# Patient Record
Sex: Female | Born: 1990 | Race: White | Hispanic: No | Marital: Single | Smoking: Never smoker
Health system: Southern US, Community
[De-identification: ages and names within clinical notes are randomized; demographics above are authoritative.]

## PROBLEM LIST (undated history)

## (undated) DIAGNOSIS — J302 Other seasonal allergic rhinitis: Secondary | ICD-10-CM

## (undated) DIAGNOSIS — G43009 Migraine without aura, not intractable, without status migrainosus: Secondary | ICD-10-CM

## (undated) DIAGNOSIS — E785 Hyperlipidemia, unspecified: Secondary | ICD-10-CM

## (undated) DIAGNOSIS — Z973 Presence of spectacles and contact lenses: Secondary | ICD-10-CM

## (undated) DIAGNOSIS — N939 Abnormal uterine and vaginal bleeding, unspecified: Secondary | ICD-10-CM

## (undated) DIAGNOSIS — E282 Polycystic ovarian syndrome: Secondary | ICD-10-CM

## (undated) HISTORY — DX: Migraine without aura, not intractable, without status migrainosus: G43.009

## (undated) HISTORY — DX: Polycystic ovarian syndrome: E28.2

## (undated) HISTORY — PX: WISDOM TOOTH EXTRACTION: SHX21

## (undated) HISTORY — DX: Morbid (severe) obesity due to excess calories: E66.01

---
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
## 2013-10-08 DIAGNOSIS — G43909 Migraine, unspecified, not intractable, without status migrainosus: Secondary | ICD-10-CM | POA: Insufficient documentation

## 2013-10-08 DIAGNOSIS — K9089 Other intestinal malabsorption: Secondary | ICD-10-CM | POA: Insufficient documentation

## 2017-01-24 ENCOUNTER — Other Ambulatory Visit (HOSPITAL_COMMUNITY)
Admission: RE | Admit: 2017-01-24 | Discharge: 2017-01-24 | Disposition: A | Discharge status: Home / Self Care | Payer: BLUE CROSS/BLUE SHIELD | Source: Ambulatory Visit | Attending: Obstetrics and Gynecology | Admitting: Obstetrics and Gynecology

## 2017-01-24 ENCOUNTER — Other Ambulatory Visit: Payer: Self-pay | Admitting: Nurse Practitioner

## 2017-01-24 DIAGNOSIS — Z01419 Encounter for gynecological examination (general) (routine) without abnormal findings: Secondary | ICD-10-CM | POA: Insufficient documentation

## 2017-01-25 ENCOUNTER — Other Ambulatory Visit: Payer: Self-pay | Admitting: Nurse Practitioner

## 2017-01-25 DIAGNOSIS — N6310 Unspecified lump in the right breast, unspecified quadrant: Secondary | ICD-10-CM

## 2017-01-30 ENCOUNTER — Other Ambulatory Visit: Payer: Self-pay

## 2017-01-30 LAB — CYTOLOGY - PAP: Diagnosis: NEGATIVE

## 2017-01-31 ENCOUNTER — Ambulatory Visit
Admission: RE | Admit: 2017-01-31 | Discharge: 2017-01-31 | Disposition: A | Discharge status: Home / Self Care | Payer: BLUE CROSS/BLUE SHIELD | Source: Ambulatory Visit | Attending: Nurse Practitioner | Admitting: Nurse Practitioner

## 2017-01-31 DIAGNOSIS — N6310 Unspecified lump in the right breast, unspecified quadrant: Secondary | ICD-10-CM

## 2017-08-19 DIAGNOSIS — Z9109 Other allergy status, other than to drugs and biological substances: Secondary | ICD-10-CM | POA: Insufficient documentation

## 2020-04-13 ENCOUNTER — Ambulatory Visit: Payer: BLUE CROSS/BLUE SHIELD | Admitting: Internal Medicine

## 2020-04-28 ENCOUNTER — Encounter: Payer: Self-pay | Admitting: Internal Medicine

## 2020-04-28 ENCOUNTER — Ambulatory Visit (INDEPENDENT_AMBULATORY_CARE_PROVIDER_SITE_OTHER): Payer: 59 | Admitting: Internal Medicine

## 2020-04-28 ENCOUNTER — Other Ambulatory Visit: Payer: Self-pay

## 2020-04-28 VITALS — BP 140/90 | HR 101 | Temp 98.2°F | Ht 62.25 in | Wt 225.9 lb

## 2020-04-28 DIAGNOSIS — R030 Elevated blood-pressure reading, without diagnosis of hypertension: Secondary | ICD-10-CM

## 2020-04-28 DIAGNOSIS — E282 Polycystic ovarian syndrome: Secondary | ICD-10-CM

## 2020-04-28 DIAGNOSIS — R221 Localized swelling, mass and lump, neck: Secondary | ICD-10-CM

## 2020-04-28 LAB — TSH: TSH: 1.98 u[IU]/mL (ref 0.35–4.50)

## 2020-04-28 LAB — T4, FREE: Free T4: 0.77 ng/dL (ref 0.60–1.60)

## 2020-04-28 LAB — T3, FREE: T3, Free: 3.7 pg/mL (ref 2.3–4.2)

## 2020-04-28 NOTE — Progress Notes (Signed)
New Patient Office Visit     This visit occurred during the SARS-CoV-2 public health emergency.  Safety protocols were in place, including screening questions prior to the visit, additional usage of staff PPE, and extensive cleaning of exam room while observing appropriate contact time as indicated for disinfecting solutions.    CC/Reason for Visit: Establish care, discuss acute and chronic concerns Previous PCP: None Last Visit: Unknown  HPI: Krystal Leonard is a 30 y.o. female who is coming in today for the above mentioned reasons.  She is here today to establish care.  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  Her family history is significant for ovarian cancer in her paternal grandmother.  She does not smoke, she drinks alcohol only occasionally.  She has no known drug allergies.  Her past surgical history is significant for wisdom teeth extraction and a breast reduction in 2007.  Since last May she has been dealing with bouts of occasional neck pain with difficulty swallowing.  She brings in some thyroid function tests from that time that were within normal range.  She has a history of polycystic ovarian syndrome, has been dealing with hirsutism and obesity as well as irregular periods.  She is not currently trying to conceive.  She does not have a local gynecologist.   Past Medical/Surgical History: Past Medical History:  Diagnosis Date  . Morbid obesity (HCC)   . PCOS (polycystic ovarian syndrome)     Past Surgical History:  Procedure Laterality Date  . REDUCTION MAMMAPLASTY Bilateral 2007   bilateral reduction 2007    Social History:  reports that she has never smoked. She has never used smokeless tobacco. She reports current alcohol use. She reports that she does not use drugs.  Allergies: Allergies  Allergen Reactions  . Latex Itching    Family History:  Family History  Problem Relation Age of Onset  . Ovarian cancer Paternal Grandmother       Current Outpatient Medications:  .  cetirizine (ZYRTEC) 10 MG tablet, Zyrtec 10 mg tablet  Take 1 tablet every day by oral route as needed., Disp: , Rfl:  .  Multiple Vitamins-Minerals (CENTRUM WOMEN PO), Take by mouth., Disp: , Rfl:  .  vitamin B-12 (CYANOCOBALAMIN) 1000 MCG tablet, Take 1,000 mcg by mouth daily., Disp: , Rfl:   Review of Systems:  Constitutional: Denies fever, chills, diaphoresis, appetite change and fatigue.  HEENT: Denies photophobia, eye pain, redness, hearing loss, ear pain, congestion, sore throat, rhinorrhea, sneezing, mouth sores,  neck stiffness and tinnitus.   Respiratory: Denies SOB, DOE, cough, chest tightness,  and wheezing.   Cardiovascular: Denies chest pain, palpitations and leg swelling.  Gastrointestinal: Denies nausea, vomiting, abdominal pain, diarrhea, constipation, blood in stool and abdominal distention.  Genitourinary: Denies dysuria, urgency, frequency, hematuria, flank pain and difficulty urinating.  Endocrine: Denies: hot or cold intolerance, sweats, changes in hair or nails, polyuria, polydipsia. Musculoskeletal: Denies myalgias, back pain, joint swelling, arthralgias and gait problem.  Skin: Denies pallor, rash and wound.  Neurological: Denies dizziness, seizures, syncope, weakness, light-headedness, numbness and headaches.  Hematological: Denies adenopathy. Easy bruising, personal or family bleeding history  Psychiatric/Behavioral: Denies suicidal ideation, mood changes, confusion, nervousness, sleep disturbance and agitation    Physical Exam: Vitals:   04/28/20 1343  BP: 140/90  Pulse: (!) 101  Temp: 98.2 F (36.8 C)  TempSrc: Oral  SpO2: 98%  Weight: 225 lb 14.4 oz (102.5 kg)  Height: 5' 2.25" (1.581 m)  Body mass index is 40.99 kg/m.  Constitutional: NAD, calm, comfortable, obese Eyes: PERRL, lids and conjunctivae normal ENMT: Mucous membranes are moist. Neck: normal, supple, significant neck fullness, no pain to  palpation Respiratory: clear to auscultation bilaterally, no wheezing, no crackles. Normal respiratory effort. No accessory muscle use.  Cardiovascular: Regular rate and rhythm, no murmurs / rubs / gallops. No extremity edema.  Neurologic: Grossly intact and nonfocal Psychiatric: Normal judgment and insight. Alert and oriented x 3. Normal mood.    Impression and Plan:  Neck fullness  -With neck fullness and occasional pain I wonder about thyroiditis. -I will order thyroid function tests today and we will send her for thyroid ultrasound.  Pending results may benefit from endocrinology referral.  PCOS (polycystic ovarian syndrome) -I will initiate referral to GYN, suspect hirsutism is related to this, might benefit from metformin or spironolactone therapy.  Morbid obesity (HCC) -Discussed healthy lifestyle, including increased physical activity and better food choices to promote weight loss.  Elevated BP without diagnosis of hypertension -She will do ambulatory blood pressure monitoring and return in 8 to 12 weeks for follow-up.    Patient Instructions  -Nice seeing you today!!  -Lab work today; will notify you once results are available.  -See you back in 3 months for your physical. Please come in fasting that day.  -Make sure you check your blood pressure 2-3 times a week until I see you next.       [name], MD [facility]

## 2020-04-28 NOTE — Patient Instructions (Signed)
-  Nice seeing you today!!  -Lab work today; will notify you once results are available.  -See you back in 3 months for your physical. Please come in fasting that day.  -Make sure you check your blood pressure 2-3 times a week until I see you next.

## 2020-04-29 ENCOUNTER — Other Ambulatory Visit: Payer: Self-pay | Admitting: Internal Medicine

## 2020-04-29 DIAGNOSIS — Z124 Encounter for screening for malignant neoplasm of cervix: Secondary | ICD-10-CM

## 2020-05-27 ENCOUNTER — Encounter: Payer: Self-pay | Admitting: Obstetrics and Gynecology

## 2020-05-27 ENCOUNTER — Ambulatory Visit (INDEPENDENT_AMBULATORY_CARE_PROVIDER_SITE_OTHER): Payer: 59 | Admitting: Obstetrics and Gynecology

## 2020-05-27 ENCOUNTER — Other Ambulatory Visit: Payer: Self-pay

## 2020-05-27 VITALS — BP 110/72 | HR 107 | Ht 62.0 in | Wt 225.0 lb

## 2020-05-27 DIAGNOSIS — N939 Abnormal uterine and vaginal bleeding, unspecified: Secondary | ICD-10-CM

## 2020-05-27 DIAGNOSIS — Z01419 Encounter for gynecological examination (general) (routine) without abnormal findings: Secondary | ICD-10-CM | POA: Diagnosis not present

## 2020-05-27 DIAGNOSIS — Z6841 Body Mass Index (BMI) 40.0 and over, adult: Secondary | ICD-10-CM | POA: Diagnosis not present

## 2020-05-27 DIAGNOSIS — E282 Polycystic ovarian syndrome: Secondary | ICD-10-CM | POA: Diagnosis not present

## 2020-05-27 DIAGNOSIS — Z0000 Encounter for general adult medical examination without abnormal findings: Secondary | ICD-10-CM

## 2020-05-27 DIAGNOSIS — L680 Hirsutism: Secondary | ICD-10-CM

## 2020-05-27 NOTE — H&P (View-Only) (Signed)
30 y.o. No obstetric history on file. Single White or Caucasian Not Hispanic or Latino female here for annual exam. She has had a traumatic experience at her last GYN visit in 2019. She has recently had some thyroid issues. She states that she went to urgent care Aug 31, 2019 feeling very lethargic. She had thyroid labs done there and has the results with her (normal).  She has also been on her period since December of 2021. She states that yesterday she had heavy bleeding She states that she filled a pad in the 5 minute drive. She filled a pad again trying to walk to the next bathroom. She then filled another pad in about 30 min. She states that after she filled a pad about every 30 min.    She also has facial hair and nipple hair.     Per review of prior notes she didn't tolerate OCP's or metformin in the past. She had breast enlargement with OCP's, had a reduction. She doesn't want to try OCP's, had severe suicidal thoughts previously on OCP's.  She bleed almost daily from 2016-2019. She had a traumatic experience with her prior gyn. Very uncomfortable with pelvic exam. She was traumatized by the experience.  She has never had regular cycles. In the last year she has had ~ 4 cycles. She typically bleeds for 5-7 days, can go weeks at a time.  This cycle started on 03/22/20 and she is still bleeding. Mostly light, some days are heavy. Yesterday was very heavy, saturated a pad in 5 minutes 2 x. Then saturated a pad every 1-1.5 hours. Today flow has been okay. Not light headed or dizzy.   She has some hair growth on chin, lower abdomen, a few around her nipples. No galactorrhea.  Never sexually active.   Patient's last menstrual period was 03/22/2020.          Sexually active: No.  The current method of family planning is none.    Exercising: No.  The patient does not participate in regular exercise at present. Smoker:  no  Health Maintenance: Pap:  01/24/2017 Normal  History of abnormal Pap:   no MMG:  None 2018 ultrasound of right breast  BMD:   None  Colonoscopy: none  TDaP:  2018 Gardasil: complete    reports that she has never smoked. She has never used smokeless tobacco. She reports current alcohol use. She reports that she does not use drugs. Social ETOH, rare. 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   Past Medical History:  Diagnosis Date  . Morbid obesity (HCC)   . PCOS (polycystic ovarian syndrome)     Past Surgical History:  Procedure Laterality Date  . REDUCTION MAMMAPLASTY Bilateral 2007   bilateral reduction 2007    Current Outpatient Medications  Medication Sig Dispense Refill  . cetirizine (ZYRTEC) 10 MG tablet Zyrtec 10 mg tablet  Take 1 tablet every day by oral route as needed.    . Multiple Vitamins-Minerals (CENTRUM WOMEN PO) Take by mouth.    . vitamin B-12 (CYANOCOBALAMIN) 1000 MCG tablet Take 1,000 mcg by mouth daily.     No current facility-administered medications for this visit.    Family History  Problem Relation Age of Onset  . Ovarian cancer Paternal Grandmother     Review of Systems  Constitutional: Positive for fatigue.  Genitourinary: Positive for vaginal bleeding.    Exam:   Pulse (!) 107   Ht 5\' 2"  (1.575 m)   Wt  225 lb (102.1 kg)   LMP 03/22/2020   SpO2 99%   BMI 41.15 kg/m   Weight change: @WEIGHTCHANGE @ Height:   Height: 5\' 2"  (157.5 cm)  Ht Readings from Last 3 Encounters:  05/27/20 5\' 2"  (1.575 m)  04/28/20 5' 2.25" (1.581 m)    General appearance: alert, cooperative and appears stated age Head: Normocephalic, without obvious abnormality, atraumatic Neck: no adenopathy, supple, symmetrical, trachea midline and thyroid normal to inspection and palpation Lungs: clear to auscultation bilaterally Cardiovascular: regular rate and rhythm Breasts: normal appearance, no masses or tenderness Abdomen: soft, non-tender; non distended,  no masses,  no organomegaly Extremities: extremities normal,  atraumatic, no cyanosis or edema Skin: Skin color, texture, turgor normal. No rashes or lesions Lymph nodes: Cervical, supraclavicular, and axillary nodes normal. No abnormal inguinal nodes palpated Neurologic: Grossly normal Pelvic: deferred.    1. Well woman exam Discussed breast self exam Discussed calcium and vit D intake  2. BMI 40.0-44.9, adult (HCC)   3. PCOS (polycystic ovarian syndrome) - Hemoglobin A1c - Lipid panel  4. Abnormal uterine bleeding (AUB) Oligomenorrhea followed by prolonged, heavy vaginal bleeding. Needs a biopsy, hasn't tolerated a pelvic exam in the office. I don't feel she will tolerate an office biopsy - CBC - Ferritin - Prolactin -Plan: EUA, hysteroscopy, dilation and curettage, pap. Reviewed risks, including: bleeding, infection, uterine perforation, fluid overload, need for further sugery   5. Hirsutism  - 17-Hydroxyprogesterone - Testosterone, Total, LC/MS/MS  6. Laboratory exam ordered as part of routine general medical examination  - CBC - Comprehensive metabolic panel - Lipid panel

## 2020-05-27 NOTE — Patient Instructions (Signed)

## 2020-05-27 NOTE — Progress Notes (Addendum)
30 y.o. No obstetric history on file. Single White or Caucasian Not Hispanic or Latino female here for annual exam. She has had a traumatic experience at her last GYN visit in 2019. She has recently had some thyroid issues. She states that she went to urgent care Aug 31, 2019 feeling very lethargic. She had thyroid labs done there and has the results with her (normal).  She has also been on her period since December of 2021. She states that yesterday she had heavy bleeding She states that she filled a pad in the 5 minute drive. She filled a pad again trying to walk to the next bathroom. She then filled another pad in about 30 min. She states that after she filled a pad about every 30 min.    She also has facial hair and nipple hair.     Per review of prior notes she didn't tolerate OCP's or metformin in the past. She had breast enlargement with OCP's, had a reduction. She doesn't want to try OCP's, had severe suicidal thoughts previously on OCP's.  She bleed almost daily from 2016-2019. She had a traumatic experience with her prior gyn. Very uncomfortable with pelvic exam. She was traumatized by the experience.  She has never had regular cycles. In the last year she has had ~ 4 cycles. She typically bleeds for 5-7 days, can go weeks at a time.  This cycle started on 03/22/20 and she is still bleeding. Mostly light, some days are heavy. Yesterday was very heavy, saturated a pad in 5 minutes 2 x. Then saturated a pad every 1-1.5 hours. Today flow has been okay. Not light headed or dizzy.   She has some hair growth on chin, lower abdomen, a few around her nipples. No galactorrhea.  Never sexually active.   Patient's last menstrual period was 03/22/2020.          Sexually active: No.  The current method of family planning is none.    Exercising: No.  The patient does not participate in regular exercise at present. Smoker:  no  Health Maintenance: Pap:  01/24/2017 Normal  History of abnormal Pap:   no MMG:  None 2018 ultrasound of right breast  BMD:   None  Colonoscopy: none  TDaP:  2018 Gardasil: complete    reports that she has never smoked. She has never used smokeless tobacco. She reports current alcohol use. She reports that she does not use drugs. Social ETOH, rare. 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   Past Medical History:  Diagnosis Date  . Morbid obesity (HCC)   . PCOS (polycystic ovarian syndrome)     Past Surgical History:  Procedure Laterality Date  . REDUCTION MAMMAPLASTY Bilateral 2007   bilateral reduction 2007    Current Outpatient Medications  Medication Sig Dispense Refill  . cetirizine (ZYRTEC) 10 MG tablet Zyrtec 10 mg tablet  Take 1 tablet every day by oral route as needed.    . Multiple Vitamins-Minerals (CENTRUM WOMEN PO) Take by mouth.    . vitamin B-12 (CYANOCOBALAMIN) 1000 MCG tablet Take 1,000 mcg by mouth daily.     No current facility-administered medications for this visit.    Family History  Problem Relation Age of Onset  . Ovarian cancer Paternal Grandmother     Review of Systems  Constitutional: Positive for fatigue.  Genitourinary: Positive for vaginal bleeding.    Exam:   Pulse (!) 107   Ht 5\' 2"  (1.575 m)   Wt  225 lb (102.1 kg)   LMP 03/22/2020   SpO2 99%   BMI 41.15 kg/m   Weight change: @WEIGHTCHANGE @ Height:   Height: 5\' 2"  (157.5 cm)  Ht Readings from Last 3 Encounters:  05/27/20 5\' 2"  (1.575 m)  04/28/20 5' 2.25" (1.581 m)    General appearance: alert, cooperative and appears stated age Head: Normocephalic, without obvious abnormality, atraumatic Neck: no adenopathy, supple, symmetrical, trachea midline and thyroid normal to inspection and palpation Lungs: clear to auscultation bilaterally Cardiovascular: regular rate and rhythm Breasts: normal appearance, no masses or tenderness Abdomen: soft, non-tender; non distended,  no masses,  no organomegaly Extremities: extremities normal,  atraumatic, no cyanosis or edema Skin: Skin color, texture, turgor normal. No rashes or lesions Lymph nodes: Cervical, supraclavicular, and axillary nodes normal. No abnormal inguinal nodes palpated Neurologic: Grossly normal Pelvic: deferred.    1. Well woman exam Discussed breast self exam Discussed calcium and vit D intake  2. BMI 40.0-44.9, adult (HCC)   3. PCOS (polycystic ovarian syndrome) - Hemoglobin A1c - Lipid panel  4. Abnormal uterine bleeding (AUB) Oligomenorrhea followed by prolonged, heavy vaginal bleeding. Needs a biopsy, hasn't tolerated a pelvic exam in the office. I don't feel she will tolerate an office biopsy - CBC - Ferritin - Prolactin -Plan: EUA, hysteroscopy, dilation and curettage, pap. Reviewed risks, including: bleeding, infection, uterine perforation, fluid overload, need for further sugery   5. Hirsutism  - 17-Hydroxyprogesterone - Testosterone, Total, LC/MS/MS  6. Laboratory exam ordered as part of routine general medical examination  - CBC - Comprehensive metabolic panel - Lipid panel    Addendum: Old records reviewed. 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 No other significant findings. Only transabdominal imaging  No renal imaging reported.

## 2020-06-01 ENCOUNTER — Telehealth: Payer: Self-pay | Admitting: *Deleted

## 2020-06-01 NOTE — Telephone Encounter (Signed)
Call placed to patient to review surgery dates and Covid quarantine requirements. Patient declines 06/13/20. Patient states she will need to review her schedule and return call.

## 2020-06-01 NOTE — Telephone Encounter (Signed)
-----   Message from [name], MD sent at 05/27/2020 12:49 PM EST ----- Krystal Leonard, Please schedule this patient for a exam under anesthesia, pap, hysteroscopy, D&C sometime soon if possible. She has been having heavy anovulatory bleeding.  H/O AUB, unable to tolerate office exam definitely won't tolerate a biopsy. No risk of pregnancy. Thanks,  [name]  CCMayme Genta

## 2020-06-02 LAB — COMPREHENSIVE METABOLIC PANEL
AG Ratio: 1.9 (calc) (ref 1.0–2.5)
ALT: 15 U/L (ref 6–29)
AST: 19 U/L (ref 10–30)
Albumin: 4.3 g/dL (ref 3.6–5.1)
Alkaline phosphatase (APISO): 92 U/L (ref 31–125)
BUN: 9 mg/dL (ref 7–25)
CO2: 24 mmol/L (ref 20–32)
Calcium: 9.2 mg/dL (ref 8.6–10.2)
Chloride: 105 mmol/L (ref 98–110)
Creat: 0.63 mg/dL (ref 0.50–1.10)
Globulin: 2.3 g/dL (calc) (ref 1.9–3.7)
Glucose, Bld: 82 mg/dL (ref 65–99)
Potassium: 4.2 mmol/L (ref 3.5–5.3)
Sodium: 139 mmol/L (ref 135–146)
Total Bilirubin: 0.5 mg/dL (ref 0.2–1.2)
Total Protein: 6.6 g/dL (ref 6.1–8.1)

## 2020-06-02 LAB — CBC
HCT: 44.8 % (ref 35.0–45.0)
Hemoglobin: 15.3 g/dL (ref 11.7–15.5)
MCH: 32.9 pg (ref 27.0–33.0)
MCHC: 34.2 g/dL (ref 32.0–36.0)
MCV: 96.3 fL (ref 80.0–100.0)
MPV: 10.1 fL (ref 7.5–12.5)
Platelets: 278 10*3/uL (ref 140–400)
RBC: 4.65 10*6/uL (ref 3.80–5.10)
RDW: 12.6 % (ref 11.0–15.0)
WBC: 7.8 10*3/uL (ref 3.8–10.8)

## 2020-06-02 LAB — PROLACTIN: Prolactin: 6.6 ng/mL

## 2020-06-02 LAB — LIPID PANEL
Cholesterol: 197 mg/dL (ref <200)
HDL: 36 mg/dL — ABNORMAL LOW (ref >=50)
LDL Cholesterol (Calc): 115 mg/dL (calc) — ABNORMAL HIGH
Non-HDL Cholesterol (Calc): 161 mg/dL (calc) — ABNORMAL HIGH (ref <130)
Total CHOL/HDL Ratio: 5.5 (calc) — ABNORMAL HIGH (ref <5.0)
Triglycerides: 319 mg/dL — ABNORMAL HIGH (ref <150)

## 2020-06-02 LAB — HEMOGLOBIN A1C
Hgb A1c MFr Bld: 4.8 % of total Hgb (ref <5.7)
Mean Plasma Glucose: 91 mg/dL
eAG (mmol/L): 5 mmol/L

## 2020-06-02 LAB — FERRITIN: Ferritin: 41 ng/mL (ref 16–154)

## 2020-06-02 LAB — TESTOSTERONE, TOTAL, LC/MS/MS: Testosterone, Total, LC-MS-MS: 63 ng/dL — ABNORMAL HIGH (ref 2–45)

## 2020-06-02 LAB — 17-HYDROXYPROGESTERONE: 17-OH-Progesterone, LC/MS/MS: 83 ng/dL

## 2020-06-07 NOTE — Telephone Encounter (Signed)
Spoke with patient, reviewed available surgery dates, patient request to proceed on 06/21/20.  Advised will return call to review pre-op once surgery confirmed. Reviewed Covid requirements.  Patient verbalizes understanding and is agreeable.   Patient states she had a PUS in 2018, has requested report to be sent to Dr. Oscar La.  Advised I will update Dr. Oscar La and return call if any additional recommendations.   Dr. Oscar La -have you received [place] report/records?   Cc: Hayley Carder

## 2020-06-07 NOTE — Telephone Encounter (Signed)
Call returned to patient.  Left detailed message, ok per dpr.  Advised 06/20/20 not available, offered 06/21/20. Return call to office to confirm.

## 2020-06-07 NOTE — Telephone Encounter (Signed)
Spoke with patient regarding surgery benefits. Patient acknowledges understanding of information presented. Patient is aware that benefits presented are professional benefits only. Patient is aware that once surgery is scheduled, the hospital will call with separate benefits. See account note.  Routing to Jill Hamm, RN, for surgery scheduling. 

## 2020-06-08 NOTE — Telephone Encounter (Signed)
Left message to call Ronnica Dreese, RN at [facility], [phone].  

## 2020-06-09 NOTE — Telephone Encounter (Signed)
Call to patient to review pre-op instructions. Patient is traveling, will return call on 2/28 to review.

## 2020-06-13 NOTE — Telephone Encounter (Signed)
Spoke with patient. Surgery date request confirmed.  Advised surgery is scheduled for 06/21/20, [facility] at 1100. Surgery instruction sheet and hospital brochure reviewed, printed copy will be mailed to address.  Patient advised if Covid screening and quarantine requirements and agreeable.   Routing to provider. Encounter closed.   Cc: Hayley Carder

## 2020-06-15 ENCOUNTER — Encounter (HOSPITAL_BASED_OUTPATIENT_CLINIC_OR_DEPARTMENT_OTHER): Payer: Self-pay | Admitting: Obstetrics and Gynecology

## 2020-06-15 ENCOUNTER — Other Ambulatory Visit: Payer: Self-pay

## 2020-06-15 NOTE — Progress Notes (Signed)
Spoke w/ via phone for pre-op interview--- PT Lab needs dos----  Urine preg (per anes)/  Pre-op orders pending             Lab results------ no COVID test ------ 06-17-2020 @ 0820 Arrive at ------- 0900 on 06-21-2020 NPO after MN NO Solid Food.  Clear liquids from MN until--- 0800 Medications to take morning of surgery ----- NONE Diabetic medication ----- n/a Patient Special Instructions ----- n/a Pre-Op special Istructions ----- sent inbox message in epic to dr Oscar La for orders Patient verbalized understanding of instructions that were given at this phone interview. Patient denies shortness of breath, chest pain, fever, cough at this phone interview.

## 2020-06-17 ENCOUNTER — Other Ambulatory Visit (HOSPITAL_COMMUNITY)
Admission: RE | Admit: 2020-06-17 | Discharge: 2020-06-17 | Disposition: A | Discharge status: Home / Self Care | Payer: 59 | Source: Ambulatory Visit | Attending: Obstetrics and Gynecology | Admitting: Obstetrics and Gynecology

## 2020-06-17 DIAGNOSIS — Z01812 Encounter for preprocedural laboratory examination: Secondary | ICD-10-CM | POA: Diagnosis present

## 2020-06-17 DIAGNOSIS — Z20822 Contact with and (suspected) exposure to covid-19: Secondary | ICD-10-CM | POA: Diagnosis not present

## 2020-06-17 LAB — SARS CORONAVIRUS 2 (TAT 6-24 HRS): SARS Coronavirus 2: NEGATIVE

## 2020-06-21 ENCOUNTER — Other Ambulatory Visit (HOSPITAL_COMMUNITY)
Admission: RE | Admit: 2020-06-21 | Discharge: 2020-06-21 | Disposition: A | Discharge status: Home / Self Care | Payer: 59 | Source: Ambulatory Visit | Attending: Obstetrics and Gynecology | Admitting: Obstetrics and Gynecology

## 2020-06-21 ENCOUNTER — Ambulatory Visit (HOSPITAL_BASED_OUTPATIENT_CLINIC_OR_DEPARTMENT_OTHER): Payer: 59 | Admitting: Registered Nurse

## 2020-06-21 ENCOUNTER — Encounter (HOSPITAL_BASED_OUTPATIENT_CLINIC_OR_DEPARTMENT_OTHER): Payer: Self-pay | Admitting: Obstetrics and Gynecology

## 2020-06-21 ENCOUNTER — Other Ambulatory Visit: Payer: Self-pay

## 2020-06-21 ENCOUNTER — Encounter (HOSPITAL_BASED_OUTPATIENT_CLINIC_OR_DEPARTMENT_OTHER)
Admission: RE | Disposition: A | Discharge status: Home / Self Care | Payer: Self-pay | Source: Home / Self Care | Attending: Obstetrics and Gynecology

## 2020-06-21 ENCOUNTER — Ambulatory Visit (HOSPITAL_BASED_OUTPATIENT_CLINIC_OR_DEPARTMENT_OTHER)
Admission: RE | Admit: 2020-06-21 | Discharge: 2020-06-21 | Disposition: A | Discharge status: Home / Self Care | Payer: 59 | Attending: Obstetrics and Gynecology | Admitting: Obstetrics and Gynecology

## 2020-06-21 DIAGNOSIS — N840 Polyp of corpus uteri: Secondary | ICD-10-CM | POA: Insufficient documentation

## 2020-06-21 DIAGNOSIS — Z8041 Family history of malignant neoplasm of ovary: Secondary | ICD-10-CM | POA: Insufficient documentation

## 2020-06-21 DIAGNOSIS — E282 Polycystic ovarian syndrome: Secondary | ICD-10-CM | POA: Insufficient documentation

## 2020-06-21 DIAGNOSIS — Z6841 Body Mass Index (BMI) 40.0 and over, adult: Secondary | ICD-10-CM | POA: Insufficient documentation

## 2020-06-21 DIAGNOSIS — N970 Female infertility associated with anovulation: Secondary | ICD-10-CM | POA: Diagnosis not present

## 2020-06-21 DIAGNOSIS — Z124 Encounter for screening for malignant neoplasm of cervix: Secondary | ICD-10-CM | POA: Insufficient documentation

## 2020-06-21 DIAGNOSIS — L680 Hirsutism: Secondary | ICD-10-CM | POA: Insufficient documentation

## 2020-06-21 DIAGNOSIS — N939 Abnormal uterine and vaginal bleeding, unspecified: Secondary | ICD-10-CM | POA: Diagnosis not present

## 2020-06-21 HISTORY — PX: HYSTEROSCOPY WITH D & C: SHX1775

## 2020-06-21 HISTORY — DX: Presence of spectacles and contact lenses: Z97.3

## 2020-06-21 HISTORY — DX: Hyperlipidemia, unspecified: E78.5

## 2020-06-21 HISTORY — DX: Other seasonal allergic rhinitis: J30.2

## 2020-06-21 HISTORY — DX: Abnormal uterine and vaginal bleeding, unspecified: N93.9

## 2020-06-21 SURGERY — EXAM UNDER ANESTHESIA
Anesthesia: General | Site: Vagina

## 2020-06-21 MED ORDER — KETOROLAC TROMETHAMINE 30 MG/ML IJ SOLN
INTRAMUSCULAR | Status: AC
  Filled 2020-06-21: qty 1

## 2020-06-21 MED ORDER — KETOROLAC TROMETHAMINE 30 MG/ML IJ SOLN
30.0000 mg | Freq: Once | INTRAMUSCULAR | Status: AC
  Administered 2020-06-21: 30 mg via INTRAVENOUS

## 2020-06-21 MED ORDER — FENTANYL CITRATE (PF) 100 MCG/2ML IJ SOLN
25.0000 ug | INTRAMUSCULAR | Status: DC | PRN
Start: 2020-06-21 — End: 2020-06-21
  Administered 2020-06-21 (×2): 50 ug via INTRAVENOUS

## 2020-06-21 MED ORDER — SCOPOLAMINE 1 MG/3DAYS TD PT72
MEDICATED_PATCH | TRANSDERMAL | Status: AC
  Filled 2020-06-21: qty 1

## 2020-06-21 MED ORDER — SODIUM CHLORIDE 0.9 % IR SOLN
Status: DC | PRN
  Administered 2020-06-21 (×2): 3000 mL

## 2020-06-21 MED ORDER — PROPOFOL 10 MG/ML IV BOLUS
INTRAVENOUS | Status: AC
  Filled 2020-06-21: qty 20

## 2020-06-21 MED ORDER — FENTANYL CITRATE (PF) 100 MCG/2ML IJ SOLN
INTRAMUSCULAR | Status: DC | PRN
  Administered 2020-06-21 (×2): 25 ug via INTRAVENOUS
  Administered 2020-06-21: 50 ug via INTRAVENOUS

## 2020-06-21 MED ORDER — ACETAMINOPHEN 500 MG PO TABS
ORAL_TABLET | ORAL | Status: AC
  Filled 2020-06-21: qty 2

## 2020-06-21 MED ORDER — MIDAZOLAM HCL 2 MG/2ML IJ SOLN
INTRAMUSCULAR | Status: AC
  Filled 2020-06-21: qty 2

## 2020-06-21 MED ORDER — DEXAMETHASONE SODIUM PHOSPHATE 10 MG/ML IJ SOLN
INTRAMUSCULAR | Status: DC | PRN
  Administered 2020-06-21: 8 mg via INTRAVENOUS

## 2020-06-21 MED ORDER — ONDANSETRON HCL 4 MG/2ML IJ SOLN
INTRAMUSCULAR | Status: AC
  Filled 2020-06-21: qty 2

## 2020-06-21 MED ORDER — PROPOFOL 10 MG/ML IV BOLUS
INTRAVENOUS | Status: DC | PRN
  Administered 2020-06-21: 200 mg via INTRAVENOUS

## 2020-06-21 MED ORDER — LACTATED RINGERS IV SOLN: INTRAVENOUS | Status: DC

## 2020-06-21 MED ORDER — FENTANYL CITRATE (PF) 100 MCG/2ML IJ SOLN
INTRAMUSCULAR | Status: AC
  Filled 2020-06-21: qty 2

## 2020-06-21 MED ORDER — AMISULPRIDE (ANTIEMETIC) 5 MG/2ML IV SOLN
INTRAVENOUS | Status: AC
  Filled 2020-06-21: qty 4

## 2020-06-21 MED ORDER — ONDANSETRON HCL 4 MG/2ML IJ SOLN
INTRAMUSCULAR | Status: DC | PRN
  Administered 2020-06-21: 4 mg via INTRAVENOUS

## 2020-06-21 MED ORDER — MIDAZOLAM HCL 5 MG/5ML IJ SOLN
INTRAMUSCULAR | Status: DC | PRN
  Administered 2020-06-21: 2 mg via INTRAVENOUS

## 2020-06-21 MED ORDER — MEPERIDINE HCL 25 MG/ML IJ SOLN: 6.2500 mg | INTRAMUSCULAR | Status: DC | PRN

## 2020-06-21 MED ORDER — AMISULPRIDE (ANTIEMETIC) 5 MG/2ML IV SOLN
10.0000 mg | Freq: Once | INTRAVENOUS | Status: AC | PRN
  Administered 2020-06-21: 10 mg via INTRAVENOUS

## 2020-06-21 MED ORDER — OXYCODONE HCL 5 MG PO TABS: 5.0000 mg | ORAL_TABLET | Freq: Once | ORAL | Status: DC | PRN

## 2020-06-21 MED ORDER — LIDOCAINE 2% (20 MG/ML) 5 ML SYRINGE
INTRAMUSCULAR | Status: DC | PRN
  Administered 2020-06-21: 80 mg via INTRAVENOUS

## 2020-06-21 MED ORDER — OXYCODONE HCL 5 MG/5ML PO SOLN
5.0000 mg | Freq: Once | ORAL | Status: DC | PRN
Start: 2020-06-21 — End: 2020-06-21

## 2020-06-21 MED ORDER — ACETAMINOPHEN 500 MG PO TABS
1000.0000 mg | ORAL_TABLET | ORAL | Status: AC
  Administered 2020-06-21: 1000 mg via ORAL

## 2020-06-21 MED ORDER — SCOPOLAMINE 1 MG/3DAYS TD PT72
MEDICATED_PATCH | TRANSDERMAL | Status: DC | PRN
  Administered 2020-06-21: 1 via TRANSDERMAL

## 2020-06-21 MED ORDER — LIDOCAINE 2% (20 MG/ML) 5 ML SYRINGE
INTRAMUSCULAR | Status: AC
  Filled 2020-06-21: qty 5

## 2020-06-21 MED ORDER — DEXAMETHASONE SODIUM PHOSPHATE 10 MG/ML IJ SOLN
INTRAMUSCULAR | Status: AC
  Filled 2020-06-21: qty 1

## 2020-06-21 SURGICAL SUPPLY — 22 items
CATH ROBINSON RED A/P 16FR (CATHETERS) IMPLANT
COVER WAND RF STERILE (DRAPES) ×3 IMPLANT
DEVICE MYOSURE LITE (MISCELLANEOUS) IMPLANT
DEVICE MYOSURE REACH (MISCELLANEOUS) ×3 IMPLANT
DILATOR CANAL MILEX (MISCELLANEOUS) IMPLANT
DRSG TELFA 3X8 NADH (GAUZE/BANDAGES/DRESSINGS) ×3 IMPLANT
GAUZE 4X4 16PLY RFD (DISPOSABLE) ×3 IMPLANT
GLOVE SURG ENC MOIS LTX SZ6.5 (GLOVE) ×3 IMPLANT
GOWN STRL REUS W/TWL LRG LVL3 (GOWN DISPOSABLE) ×3 IMPLANT
IV NS IRRIG 3000ML ARTHROMATIC (IV SOLUTION) ×6 IMPLANT
KIT PROCEDURE FLUENT (KITS) ×3 IMPLANT
KIT TURNOVER CYSTO (KITS) ×3 IMPLANT
MYOSURE XL FIBROID (MISCELLANEOUS)
PACK VAGINAL MINOR WOMEN LF (CUSTOM PROCEDURE TRAY) ×3 IMPLANT
PAD OB MATERNITY 4.3X12.25 (PERSONAL CARE ITEMS) ×3 IMPLANT
PAD PREP 24X48 CUFFED NSTRL (MISCELLANEOUS) ×3 IMPLANT
SEAL CERVICAL OMNI LOK (ABLATOR) IMPLANT
SEAL ROD LENS SCOPE MYOSURE (ABLATOR) ×3 IMPLANT
SYR 20ML LL LF (SYRINGE) IMPLANT
SYSTEM TISS REMOVAL MYOSURE XL (MISCELLANEOUS) IMPLANT
TOWEL OR 17X26 10 PK STRL BLUE (TOWEL DISPOSABLE) ×3 IMPLANT
WATER STERILE IRR 500ML POUR (IV SOLUTION) IMPLANT

## 2020-06-21 NOTE — Transfer of Care (Signed)
Immediate Anesthesia Transfer of Care Note  Patient: Krystal Leonard  Procedure(s) Performed: Francia Greaves UNDER ANESTHESIA (N/A Vagina ) DILATATION AND CURETTAGE /HYSTEROSCOPY (N/A Uterus)  Patient Location: PACU  Anesthesia Type:General  Level of Consciousness: awake, alert , oriented and patient cooperative  Airway & Oxygen Therapy: Patient Spontanous Breathing and Patient connected to face mask oxygen  Post-op Assessment: Report given to RN, Post -op Vital signs reviewed and stable and Patient moving all extremities  Post vital signs: Reviewed and stable  Last Vitals:  Vitals Value Taken Time  BP 122/78 06/21/20 1147  Temp    Pulse 90 06/21/20 1151  Resp 23 06/21/20 1151  SpO2 93 % 06/21/20 1151  Vitals shown include unvalidated device data.  Last Pain:  Vitals:   06/21/20 1003  TempSrc: Oral  PainSc: 0-No pain      Patients Stated Pain Goal: 7 (06/21/20 1003)  Complications: No complications documented.

## 2020-06-21 NOTE — Op Note (Signed)
Preoperative Diagnosis: cervical cancer screening, abnormal uterine bleeding  Postoperative Diagnosis: cervical cancer screening, abnormal uterine bleeding, endometrial polyps, uterine anomaly   Procedure: Examination under anesthesia, pap smear, hysteroscopy, polypectomy, dilation and curettage  Surgeon: Dr Gertie Exon  Assistants: None  Anesthesia: General via LMA  EBL: 5 cc  Fluids: 600 cc  Fluid deficit: 35 cc  Urine output: not recorded  Indications for surgery: The patient is a 30 yo female, who presented with a history of oligomenorrhea followed by prolonged, heavy vaginal bleeding. She has a history of a traumatic prior gyn exam. Since she needed endometrial sampling, examination was deferred for the OR. Work up included a normal prolactin, a normal TSH and a normal CBC. The risks of the surgery were reviewed with the patient and the consent form was signed prior to her surgery.  Findings: Examination under anesthesia revealed a normal hymen, normal vaginal mucosa, normal appearing cervix. Uterus was anteverted and mobile, no adnexal masses. Hysteroscopy: indented fundus, consistent with a uterine anomaly (septate or bicornuate); thickened endometrium, several endometrial polyps; normal tubal ostia bilaterally.  Specimens: pap smear, endometrial polyps, endometrial curettings   Procedure: The patient was taken to the operating room with an IV in place. She voided on the way to the OR.  Anesthesia was administered and she was placed in the dorsal lithotomy position . An examination was performed and a pap smear was obtained. She was then prepped and draped in the usual sterile fashion for a vaginal procedure.  A  speculum was placed in the vagina and a single tooth tenaculum was placed on the anterior lip of the cervix. The cervix was dilated to a # 6.5 hagar dilator. The uterus was sounded to 9 cm. The myosure hysteroscope was inserted into the uterine cavity. With continuous  infusion of normal saline, the uterine cavity was visualized with the above findings. The myosure reach was used to resect the polyps and a large amount of the endometrium. The myosure was then removed. The cavity was then curetted with the small sharp curette. The cavity had the characteristically gritty texture at the end of the procedure. The curette was removed and the myosure was reinserted into the uterine cavity. A few small areas of residual endometrium were resected. The myosure and the single tooth tenaculum were removed. The speculum was removed. The patients perineum was cleansed of betadine and she was taken out of the dorsal lithotomy position.  Upon awakening the LMA was removed and the patient was transferred to the recovery room in stable and awake condition.  The sponge and instrument count were correct. There were no complications.

## 2020-06-21 NOTE — Anesthesia Preprocedure Evaluation (Addendum)
Anesthesia Evaluation  Patient identified by MRN, date of birth, ID band Patient awake    Reviewed: Allergy & Precautions, NPO status , Patient's Chart, lab work & pertinent test results  Airway Mallampati: II  TM Distance: >3 FB Neck ROM: Full    Dental  (+) Dental Advisory Given, Teeth Intact   Pulmonary neg pulmonary ROS,    Pulmonary exam normal breath sounds clear to auscultation       Cardiovascular negative cardio ROS Normal cardiovascular exam Rhythm:Regular Rate:Normal     Neuro/Psych  Headaches,    GI/Hepatic negative GI ROS, Neg liver ROS,   Endo/Other  Morbid obesity  Renal/GU negative Renal ROS     Musculoskeletal negative musculoskeletal ROS (+)   Abdominal (+) + obese,   Peds  Hematology negative hematology ROS (+)   Anesthesia Other Findings   Reproductive/Obstetrics                            Anesthesia Physical Anesthesia Plan  ASA: III  Anesthesia Plan: General   Post-op Pain Management:    Induction: Intravenous  PONV Risk Score and Plan: 4 or greater and Ondansetron, Dexamethasone, Midazolam, Scopolamine patch - Pre-op and Treatment may vary due to age or medical condition  Airway Management Planned: LMA  Additional Equipment: None  Intra-op Plan:   Post-operative Plan: Extubation in OR  Informed Consent: I have reviewed the patients History and Physical, chart, labs and discussed the procedure including the risks, benefits and alternatives for the proposed anesthesia with the patient or authorized representative who has indicated his/her understanding and acceptance.     Dental advisory given  Plan Discussed with: CRNA  Anesthesia Plan Comments:        Anesthesia Quick Evaluation

## 2020-06-21 NOTE — Discharge Instructions (Signed)

## 2020-06-21 NOTE — Anesthesia Procedure Notes (Signed)
Procedure Name: LMA Insertion Date/Time: 06/21/2020 10:57 AM Performed by: Elisabeth Cara, CRNA Pre-anesthesia Checklist: Patient identified, Emergency Drugs available, Suction available, Patient being monitored and Timeout performed Patient Re-evaluated:Patient Re-evaluated prior to induction Oxygen Delivery Method: Circle system utilized Preoxygenation: Pre-oxygenation with 100% oxygen Induction Type: IV induction Ventilation: Mask ventilation without difficulty LMA: LMA with gastric port inserted LMA Size: 4.0 Number of attempts: 1 Placement Confirmation: positive ETCO2 and breath sounds checked- equal and bilateral Tube secured with: Tape Dental Injury: Teeth and Oropharynx as per pre-operative assessment

## 2020-06-21 NOTE — Interval H&P Note (Signed)
History and Physical Interval Note:  06/21/2020 9:17 AM  Krystal Leonard  has presented today for surgery, with the diagnosis of Anovulatory bleeding.  The various methods of treatment have been discussed with the patient and family. After consideration of risks, benefits and other options for treatment, the patient has consented to  Procedure(s) with comments: EXAM UNDER ANESTHESIA (N/A) - Exam under anesthesia, pap smear, hysteroscopy D&C DILATATION AND CURETTAGE /HYSTEROSCOPY (N/A) as a surgical intervention.  The patient's history has been reviewed, patient examined, no change in status, stable for surgery.  I have reviewed the patient's chart and labs.  Questions were answered to the patient's satisfaction.    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 She also reports issues with difficulty inserting tampons.  Will add possible hymen repair onto the consent. She has had speculum exams in the office in the past and has never been told she had a hymen or vaginal abnormality.  [name]

## 2020-06-22 ENCOUNTER — Encounter (HOSPITAL_BASED_OUTPATIENT_CLINIC_OR_DEPARTMENT_OTHER): Payer: Self-pay | Admitting: Obstetrics and Gynecology

## 2020-06-22 LAB — CYTOLOGY - PAP
Comment: NEGATIVE
Diagnosis: NEGATIVE
High risk HPV: NEGATIVE

## 2020-06-22 LAB — SURGICAL PATHOLOGY

## 2020-06-22 NOTE — Anesthesia Postprocedure Evaluation (Signed)
Anesthesia Post Note  Patient: Krystal Leonard  Procedure(s) Performed: Francia Greaves UNDER ANESTHESIA (N/A Vagina ) DILATATION AND CURETTAGE /HYSTEROSCOPY (N/A Uterus)     Patient location during evaluation: PACU Anesthesia Type: General Level of consciousness: sedated and patient cooperative Pain management: pain level controlled Vital Signs Assessment: post-procedure vital signs reviewed and stable Respiratory status: spontaneous breathing Cardiovascular status: stable Anesthetic complications: no   No complications documented.  Last Vitals:  Vitals:   06/21/20 1215 06/21/20 1255  BP: 127/76 128/81  Pulse: 81 76  Resp: (!) 21 19  Temp: (!) 36.4 C (!) 36.4 C  SpO2: 99% 99%    Last Pain:  Vitals:   06/22/20 1011  TempSrc:   PainSc: 2                  Lewie Loron

## 2020-07-06 ENCOUNTER — Other Ambulatory Visit: Payer: Self-pay

## 2020-07-06 ENCOUNTER — Ambulatory Visit (INDEPENDENT_AMBULATORY_CARE_PROVIDER_SITE_OTHER): Payer: 59 | Admitting: Obstetrics and Gynecology

## 2020-07-06 ENCOUNTER — Encounter: Payer: Self-pay | Admitting: Obstetrics and Gynecology

## 2020-07-06 VITALS — BP 140/72 | HR 119 | Ht 62.0 in | Wt 224.0 lb

## 2020-07-06 DIAGNOSIS — N915 Oligomenorrhea, unspecified: Secondary | ICD-10-CM | POA: Diagnosis not present

## 2020-07-06 DIAGNOSIS — Q513 Bicornate uterus: Secondary | ICD-10-CM | POA: Diagnosis not present

## 2020-07-06 DIAGNOSIS — E282 Polycystic ovarian syndrome: Secondary | ICD-10-CM | POA: Diagnosis not present

## 2020-07-06 MED ORDER — MEDROXYPROGESTERONE ACETATE 5 MG PO TABS: ORAL_TABLET | ORAL | 1 refills | Status: DC

## 2020-07-06 NOTE — Progress Notes (Signed)
GYNECOLOGY  VISIT   HPI: 30 y.o.   Single White or Caucasian Not Hispanic or Latino  female   [id number] with No LMP recorded (lmp unknown).   here for a 2 week post operative visit. She is s/p examination under anesthesia, pap smear, hysteroscopy, polypectomy and D&C. 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   She hasn't had a renal ultrasound yet.  Pap and hpv testing were negative. Pathology with disordered proliferative endometrium, benign polyp.   She states that she has been colder than normal. She has also been experiencing more headache.  She hasn't been sexually active. Hasn't done well with OCP's in the past.   GYNECOLOGIC HISTORY: No LMP recorded (lmp unknown). Contraception:none Menopausal hormone therapy: none        OB History    Gravida  0   Para  0   Term  0   Preterm  0   AB  0   Living  0     SAB  0   IAB  0   Ectopic  0   Multiple  0   Live Births  0              Patient Active Problem List   Diagnosis Date Noted  . PCOS (polycystic ovarian syndrome)   . Morbid obesity (HCC)   . Environmental allergies 08/19/2017  . 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 Migraine 10/08/2013    Past Medical History:  Diagnosis Date  . Abnormal uterine bleeding (AUB)   . Hyperlipidemia   . PCOS (polycystic ovarian syndrome)   . Seasonal allergies   . Wears glasses     Past Surgical History:  Procedure Laterality Date  . HYSTEROSCOPY WITH D & C N/A 06/21/2020   Procedure: DILATATION AND CURETTAGE /HYSTEROSCOPY;  Surgeon: [name], MD;  Location: [facility];  Service: Gynecology;  Laterality: N/A;  . REDUCTION MAMMAPLASTY Bilateral 2007  . WISDOM TOOTH EXTRACTION  teen    Current Outpatient Medications  Medication Sig Dispense Refill  . acetaminophen (TYLENOL) 500 MG tablet Take 1,000 mg by mouth every 6 (six) hours as needed.    .  cetirizine (ZYRTEC) 10 MG tablet Take 10 mg by mouth daily as needed.    . Multiple Vitamins-Minerals (CENTRUM WOMEN PO) Take 1 capsule by mouth daily.    . naproxen sodium (ALEVE) 220 MG tablet Take 220 mg by mouth 2 (two) times daily as needed.    Marland Kitchen RASPBERRY PO Take by mouth daily.    . Red Yeast Rice Extract (RED YEAST RICE PO) Take by mouth daily.    . vitamin B-12 (CYANOCOBALAMIN) 1000 MCG tablet Take 1,000 mcg by mouth daily.     No current facility-administered medications for this visit.     ALLERGIES: Latex and Lactose intolerance (gi)  Family History  Problem Relation Age of Onset  . Ovarian cancer Paternal Grandmother     Social History   Socioeconomic History  . Marital status: Single    Spouse name: Not on file  . Number of children: Not on file  . Years of education: Not on file  . Highest education level: Not on file  Occupational History  . Not on file  Tobacco Use  . Smoking status: Never Smoker  . Smokeless tobacco: Never Used  Vaping Use  . Vaping Use: Never used  Substance and Sexual Activity  . Alcohol  use: Yes    Comment: occasional  . Drug use: Never  . Sexual activity: Not on file  Other Topics Concern  . Not on file  Social History Narrative  . Not on file   Social Determinants of Health   Financial Resource Strain: Not on file  Food Insecurity: Not on file  Transportation Needs: Not on file  Physical Activity: Not on file  Stress: Not on file  Social Connections: Not on file  Intimate Partner Violence: Not on file    Review of Systems  All other systems reviewed and are negative.   PHYSICAL EXAMINATION:    LMP  (LMP Unknown)     General appearance: alert, cooperative and appears stated age  22. PCOS (polycystic ovarian syndrome)   2. Oligomenorrhea, unspecified type Discussed options for treatment. Will take cyclic provera as needed - medroxyPROGESTERone (PROVERA) 5 MG tablet; Take one tablet a day for 5 days every other  month if no spontaneous menses.  Dispense: 15 tablet; Refill: 1  3. Bicornuate uterus Needs evaluation of her kidneys - US RENAL; Future

## 2020-07-07 ENCOUNTER — Encounter: Payer: Self-pay | Admitting: Obstetrics and Gynecology

## 2020-07-07 ENCOUNTER — Telehealth: Payer: Self-pay | Admitting: *Deleted

## 2020-07-07 NOTE — Telephone Encounter (Signed)
-----   Message from [name], MD sent at 07/06/2020  4:49 PM EDT ----- I've ordered a renal ultrasound on this patient. Will you please confirm that I did it correctly. Thank you! [name]

## 2020-07-07 NOTE — Telephone Encounter (Signed)
Yes, the patient had a pelvic ultrasound and was diagnosed with a bicornuate uterus. They didn't evaluate her kidneys and patients with a uterine anomaly have an increased risk of a kidney anomaly.  Thank you for checking.

## 2020-07-07 NOTE — Telephone Encounter (Addendum)
I called [facility] and spoke with scheduler and was told to double check with you and to let you know that the renal ultrasound will only show bladder and kidneys and not the uterus. She wanted to make sure you didn't want to schedule a pelvic ultrasound? Please advise

## 2020-07-08 NOTE — Telephone Encounter (Signed)
I called [facility] and informed the below note.  And the order is correct.

## 2020-07-21 ENCOUNTER — Other Ambulatory Visit: Payer: 59

## 2020-07-21 ENCOUNTER — Ambulatory Visit
Admission: RE | Admit: 2020-07-21 | Discharge: 2020-07-21 | Disposition: A | Discharge status: Home / Self Care | Payer: 59 | Source: Ambulatory Visit | Attending: Obstetrics and Gynecology | Admitting: Obstetrics and Gynecology

## 2020-07-21 DIAGNOSIS — Q513 Bicornate uterus: Secondary | ICD-10-CM

## 2020-07-26 ENCOUNTER — Telehealth (INDEPENDENT_AMBULATORY_CARE_PROVIDER_SITE_OTHER): Payer: 59 | Admitting: Internal Medicine

## 2020-07-26 ENCOUNTER — Encounter: Payer: Self-pay | Admitting: Internal Medicine

## 2020-07-26 ENCOUNTER — Other Ambulatory Visit: Payer: Self-pay

## 2020-07-26 VITALS — Temp 98.5°F | Wt 225.0 lb

## 2020-07-26 DIAGNOSIS — J069 Acute upper respiratory infection, unspecified: Secondary | ICD-10-CM | POA: Diagnosis not present

## 2020-07-26 NOTE — Progress Notes (Signed)
Virtual Visit via Video Note  I connected with Krystal Leonard on 07/26/20 at 10:30 AM EDT by a video enabled telemedicine application and verified that I am speaking with the correct person using two identifiers.  Location patient: home Location provider: work office Persons participating in the virtual visit: patient, provider  I discussed the limitations of evaluation and management by telemedicine and the availability of in person appointments. The patient expressed understanding and agreed to proceed.   HPI: 2 days ago she started experiencing a sore throat, headache postnasal drip, congestion and mild chills.  Her T-max has been 98.5.  She tells me she typically gets a sinus infection every time this year.  This morning she took a home Covid test that was negative.  She took a Zyrtec last night.   ROS: Constitutional: Denies fever,  diaphoresis, appetite change and fatigue.  HEENT: Denies photophobia, eye pain, redness, hearing loss,  mouth sores, trouble swallowing, neck pain, neck stiffness and tinnitus.   Respiratory: Denies SOB, DOE, cough, chest tightness,  and wheezing.   Cardiovascular: Denies chest pain, palpitations and leg swelling.  Gastrointestinal: Denies nausea, vomiting, abdominal pain, diarrhea, constipation, blood in stool and abdominal distention.  Genitourinary: Denies dysuria, urgency, frequency, hematuria, flank pain and difficulty urinating.  Endocrine: Denies: hot or cold intolerance, sweats, changes in hair or nails, polyuria, polydipsia. Musculoskeletal: Denies myalgias, back pain, joint swelling, arthralgias and gait problem.  Skin: Denies pallor, rash and wound.  Neurological: Denies dizziness, seizures, syncope, weakness, light-headedness, numbness. Hematological: Denies adenopathy. Easy bruising, personal or family bleeding history  Psychiatric/Behavioral: Denies suicidal ideation, mood changes, confusion, nervousness, sleep disturbance  and agitation   Past Medical History:  Diagnosis Date  . Abnormal uterine bleeding (AUB)   . Hyperlipidemia   . PCOS (polycystic ovarian syndrome)   . Seasonal allergies   . Wears glasses     Past Surgical History:  Procedure Laterality Date  . HYSTEROSCOPY WITH D & C N/A 06/21/2020   Procedure: DILATATION AND CURETTAGE /HYSTEROSCOPY;  Surgeon: [name], MD;  Location: [facility];  Service: Gynecology;  Laterality: N/A;  . REDUCTION MAMMAPLASTY Bilateral 2007  . WISDOM TOOTH EXTRACTION  [age]    Family History  Problem Relation Age of Onset  . Ovarian cancer Paternal Grandmother     SOCIAL HX:   reports that she has never smoked. She has never used smokeless tobacco. She reports current alcohol use. She reports that she does not use drugs.   Current Outpatient Medications:  .  acetaminophen (TYLENOL) 500 MG tablet, Take 1,000 mg by mouth every 6 (six) hours as needed., Disp: , Rfl:  .  cetirizine (ZYRTEC) 10 MG tablet, Take 10 mg by mouth daily as needed., Disp: , Rfl:  .  Multiple Vitamins-Minerals (CENTRUM WOMEN PO), Take 1 capsule by mouth daily., Disp: , Rfl:  .  naproxen sodium (ALEVE) 220 MG tablet, Take 220 mg by mouth 2 (two) times daily as needed., Disp: , Rfl:  .  RASPBERRY PO, Take by mouth daily., Disp: , Rfl:  .  Red Yeast Rice Extract (RED YEAST RICE PO), Take by mouth daily., Disp: , Rfl:  .  vitamin B-12 (CYANOCOBALAMIN) 1000 MCG tablet, Take 1,000 mcg by mouth daily., Disp: , Rfl:  .  medroxyPROGESTERone (PROVERA) 5 MG tablet, Take one tablet a day for 5 days every other month if no spontaneous menses. (Patient not taking: Reported on 07/26/2020), Disp: 15 tablet, Rfl: 1  EXAM:  VITALS per patient if applicable: T-max of 98.5  GENERAL: alert, oriented, appears well and in no acute distress, sounds congested  HEENT: atraumatic, conjunttiva clear, no obvious abnormalities on inspection of external nose and ears  NECK: normal  movements of the head and neck  LUNGS: on inspection no signs of respiratory distress, breathing rate appears normal, no obvious gross increased work of breathing, gasping or wheezing  CV: no obvious cyanosis  MS: moves all visible extremities without noticeable abnormality  PSYCH/NEURO: pleasant and cooperative, no obvious depression or anxiety, speech and thought processing grossly intact  ASSESSMENT AND PLAN:   Viral upper respiratory tract infection -I believe it is too early in the course of her illness to consider antibiotics. -She will try antihistamines, guaifenesin, over-the-counter sinus preparations. -I have also advised her to either do a Covid PCR test or to do a repeat home antigen test in 3 days to fully rule out the possibility of a Covid infection. -She knows to contact me if either her Covid test is positive or if she fails to improve in 14 days.    I discussed the assessment and treatment plan with the patient. The patient was provided an opportunity to ask questions and all were answered. The patient agreed with the plan and demonstrated an understanding of the instructions.   The patient was advised to call back or seek an in-person evaluation if the symptoms worsen or if the condition fails to improve as anticipated.    Krystal Jan, MD  [facility]

## 2020-08-24 ENCOUNTER — Other Ambulatory Visit: Payer: Self-pay | Admitting: Obstetrics and Gynecology

## 2020-08-24 DIAGNOSIS — N915 Oligomenorrhea, unspecified: Secondary | ICD-10-CM

## 2020-10-11 ENCOUNTER — Other Ambulatory Visit: Payer: Self-pay | Admitting: Obstetrics and Gynecology

## 2020-10-11 DIAGNOSIS — N915 Oligomenorrhea, unspecified: Secondary | ICD-10-CM

## 2021-06-22 ENCOUNTER — Ambulatory Visit: Payer: Managed Care, Other (non HMO) | Admitting: Internal Medicine

## 2021-06-22 ENCOUNTER — Encounter: Payer: Self-pay | Admitting: Internal Medicine

## 2021-06-22 VITALS — BP 110/80 | HR 87 | Temp 98.2°F | Wt 219.9 lb

## 2021-06-22 DIAGNOSIS — H9203 Otalgia, bilateral: Secondary | ICD-10-CM

## 2021-06-22 DIAGNOSIS — R5383 Other fatigue: Secondary | ICD-10-CM

## 2021-06-22 LAB — CBC WITH DIFFERENTIAL/PLATELET
Basophils Absolute: 0 10*3/uL (ref 0.0–0.1)
Basophils Relative: 0.4 % (ref 0.0–3.0)
Eosinophils Absolute: 0.1 10*3/uL (ref 0.0–0.7)
Eosinophils Relative: 1.4 % (ref 0.0–5.0)
HCT: 46.4 % — ABNORMAL HIGH (ref 36.0–46.0)
Hemoglobin: 15.8 g/dL — ABNORMAL HIGH (ref 12.0–15.0)
Lymphocytes Relative: 30.5 % (ref 12.0–46.0)
Lymphs Abs: 3 10*3/uL (ref 0.7–4.0)
MCHC: 34.1 g/dL (ref 30.0–36.0)
MCV: 95.3 fl (ref 78.0–100.0)
Monocytes Absolute: 0.7 10*3/uL (ref 0.1–1.0)
Monocytes Relative: 7.3 % (ref 3.0–12.0)
Neutro Abs: 6 10*3/uL (ref 1.4–7.7)
Neutrophils Relative %: 60.4 % (ref 43.0–77.0)
Platelets: 297 10*3/uL (ref 150.0–400.0)
RBC: 4.87 Mil/uL (ref 3.87–5.11)
RDW: 13.3 % (ref 11.5–15.5)
WBC: 9.9 10*3/uL (ref 4.0–10.5)

## 2021-06-22 LAB — COMPREHENSIVE METABOLIC PANEL
ALT: 12 U/L (ref 0–35)
AST: 14 U/L (ref 0–37)
Albumin: 4.7 g/dL (ref 3.5–5.2)
Alkaline Phosphatase: 98 U/L (ref 39–117)
BUN: 14 mg/dL (ref 6–23)
CO2: 29 mEq/L (ref 19–32)
Calcium: 9.9 mg/dL (ref 8.4–10.5)
Chloride: 101 mEq/L (ref 96–112)
Creatinine, Ser: 0.7 mg/dL (ref 0.40–1.20)
GFR: 114.65 mL/min (ref >60.00)
Glucose, Bld: 83 mg/dL (ref 70–99)
Potassium: 4 mEq/L (ref 3.5–5.1)
Sodium: 138 mEq/L (ref 135–145)
Total Bilirubin: 0.4 mg/dL (ref 0.2–1.2)
Total Protein: 7.4 g/dL (ref 6.0–8.3)

## 2021-06-22 LAB — T3, FREE: T3, Free: 3.9 pg/mL (ref 2.3–4.2)

## 2021-06-22 LAB — TSH: TSH: 2.13 u[IU]/mL (ref 0.35–5.50)

## 2021-06-22 LAB — T4, FREE: Free T4: 0.69 ng/dL (ref 0.60–1.60)

## 2021-06-22 NOTE — Progress Notes (Signed)
? ? ? ?Established Patient Office Visit ? ? ? ? ?This visit occurred during the SARS-CoV-2 public health emergency.  Safety protocols were in place, including screening questions prior to the visit, additional usage of staff PPE, and extensive cleaning of exam room while observing appropriate contact time as indicated for disinfecting solutions.  ? ? ?CC/Reason for Visit: Ear discomfort, requesting labs ? ?HPI: Krystal Leonard is a 31 y.o. female who is coming in today for the above mentioned reasons. Past Medical History is significant for: Obesity and PCOS.  She started seeing a nutritionist and is requesting labs due to her fatigue she is requesting blood counts as well as thyroid function tests.  About a month ago she had a URI.  4 days ago she started experiencing right greater than left ear pain improved after taking Tylenol, she no longer has pain.  She has started to have a runny nose, wonders if it is her usual spring allergies, she has started taking an antihistamine. ? ? ?Past Medical/Surgical History: ?Past Medical History:  ?Diagnosis Date  ? Abnormal uterine bleeding (AUB)   ? Hyperlipidemia   ? PCOS (polycystic ovarian syndrome)   ? Seasonal allergies   ? Wears glasses   ? ? ?Past Surgical History:  ?Procedure Laterality Date  ? HYSTEROSCOPY WITH D & C N/A 06/21/2020  ? Procedure: DILATATION AND CURETTAGE /HYSTEROSCOPY;  Surgeon: [name], MD;  Location: [facility];  Service: Gynecology;  Laterality: N/A;  ? REDUCTION MAMMAPLASTY Bilateral 2007  ? WISDOM TOOTH EXTRACTION  teen  ? ? ?Social History: ? reports that she has never smoked. She has never used smokeless tobacco. She reports current alcohol use. She reports that she does not use drugs. ? ?Allergies: ?Allergies  ?Allergen Reactions  ? Latex Itching  ?  Per pt the area with redness and numbness of hands when wearing latex gloves  ? Lactose Intolerance (Gi)   ? ? ?Family History:  ?Family History  ?Problem  Relation Age of Onset  ? Ovarian cancer Paternal Grandmother   ? ? ? ?Current Outpatient Medications:  ?  acetaminophen (TYLENOL) 500 MG tablet, Take 1,000 mg by mouth every 6 (six) hours as needed., Disp: , Rfl:  ?  cetirizine (ZYRTEC) 10 MG tablet, Take 10 mg by mouth daily as needed., Disp: , Rfl:  ?  medroxyPROGESTERone (PROVERA) 5 MG tablet, Take one tablet a day for 5 days every other month if no spontaneous menses., Disp: 15 tablet, Rfl: 1 ?  Multiple Vitamins-Minerals (CENTRUM WOMEN PO), Take 1 capsule by mouth daily., Disp: , Rfl:  ?  naproxen sodium (ALEVE) 220 MG tablet, Take 220 mg by mouth 2 (two) times daily as needed., Disp: , Rfl:  ?  vitamin B-12 (CYANOCOBALAMIN) 1000 MCG tablet, Take 1,000 mcg by mouth daily., Disp: , Rfl:  ? ?Review of Systems:  ?Constitutional: Denies fever, chills, diaphoresis, appetite change. ?HEENT: Denies photophobia, eye pain, redness, mouth sores, trouble swallowing, neck pain, neck stiffness and tinnitus.   ?Respiratory: Denies SOB, DOE,  chest tightness,  and wheezing.   ?Cardiovascular: Denies chest pain, palpitations and leg swelling.  ?Gastrointestinal: Denies nausea, vomiting, abdominal pain, diarrhea, constipation, blood in stool and abdominal distention.  ?Genitourinary: Denies dysuria, urgency, frequency, hematuria, flank pain and difficulty urinating.  ?Endocrine: Denies: hot or cold intolerance, sweats, changes in hair or nails, polyuria, polydipsia. ?Musculoskeletal: Denies myalgias, back pain, joint swelling, arthralgias and gait problem.  ?Skin: Denies pallor, rash and wound.  ?Neurological:  Denies dizziness, seizures, syncope, weakness, light-headedness, numbness and headaches.  ?Hematological: Denies adenopathy. Easy bruising, personal or family bleeding history  ?Psychiatric/Behavioral: Denies suicidal ideation, mood changes, confusion, nervousness, sleep disturbance and agitation ? ? ? ?Physical Exam: ?Vitals:  ? 06/22/21 1301  ?BP: 110/80  ?Pulse: 87   ?Temp: 98.2 ?F (36.8 ?C)  ?TempSrc: Oral  ?SpO2: 98%  ?Weight: 219 lb 14.4 oz (99.7 kg)  ? ? ?Body mass index is 40.22 kg/m?. ? ? ?Constitutional: NAD, calm, comfortable ?Eyes: PERRL, lids and conjunctivae normal ?ENMT: Mucous membranes are moist.  Tympanic membrane is pearly white, no erythema or bulging. ?Respiratory: clear to auscultation bilaterally, no wheezing, no crackles. Normal respiratory effort. No accessory muscle use.  ?Cardiovascular: Regular rate and rhythm, no murmurs / rubs / gallops. No extremity edema.  ?Neurologic: Grossly intact and nonfocal ?Psychiatric: Normal judgment and insight. Alert and oriented x 3. Normal mood.  ? ? ?Impression and Plan: ? ?Ear pain, bilateral ?-No sign of ear infection, suspect possibly congestion/eustachian tube dysfunction.  Have advised use of guaifenesin and antihistamine daily. ? ?Morbid obesity (HCC) ?-Discussed healthy lifestyle, including increased physical activity and better food choices to promote weight loss. ? ?Fatigue, unspecified type  ?- Plan: CBC with Differential/Platelet, Comprehensive metabolic panel, TSH, T4, free, T3, free.  Labs ordered per request ? ?Time spent: 30 minutes reviewing chart, interviewing and examining patient and formulating plan of care. ? ? ?Patient Instructions  ?-Nice seeing you today!! ? ?-Lab work today; will notify you once results are available. ? ?-Schedule follow up in 6 months or sooner as needed. ? ? ? ?[name], MD ?[facility] ? ? ?

## 2021-06-22 NOTE — Patient Instructions (Signed)
-  Nice seeing you today!!  -Lab work today; will notify you once results are available.  -Schedule follow up in 6 months or sooner as needed.   

## 2021-07-10 ENCOUNTER — Other Ambulatory Visit (INDEPENDENT_AMBULATORY_CARE_PROVIDER_SITE_OTHER): Payer: Self-pay

## 2021-08-15 ENCOUNTER — Telehealth (INDEPENDENT_AMBULATORY_CARE_PROVIDER_SITE_OTHER): Payer: Managed Care, Other (non HMO) | Admitting: Internal Medicine

## 2021-08-15 ENCOUNTER — Encounter: Payer: Self-pay | Admitting: Internal Medicine

## 2021-08-15 VITALS — Temp 100.2°F

## 2021-08-15 DIAGNOSIS — U071 COVID-19: Secondary | ICD-10-CM

## 2021-08-15 MED ORDER — NIRMATRELVIR/RITONAVIR (PAXLOVID)TABLET: 3.0000 | ORAL_TABLET | Freq: Two times a day (BID) | ORAL | 0 refills | Status: AC

## 2021-08-15 NOTE — Progress Notes (Signed)
? ? ?Virtual Visit via Video Note ? ?I connected with Krystal Leonard on 08/15/21 at  3:30 PM EDT by a video enabled telemedicine application and verified that I am speaking with the correct person using two identifiers. ? ?Location patient: home ?Location provider: work office ?Persons participating in the virtual visit: patient, provider ? ?I discussed the limitations of evaluation and management by telemedicine and the availability of in person appointments. The patient expressed understanding and agreed to proceed. ? ? ?HPI: ?She tested positive for COVID today.  Yesterday she started experiencing postnasal drip and a sore throat.  Today she had chills, a fever with a Tmax of 100.6, cough, rhinorrhea, congestion and body aches. ? ? ?ROS: ?Constitutional: Denies fever, chills, diaphoresis, appetite change. ?HEENT: Denies photophobia, eye pain, redness, mouth sores, trouble swallowing, neck pain, neck stiffness and tinnitus.   ?Respiratory: Denies SOB, DOE,  chest tightness,  and wheezing.   ?Cardiovascular: Denies chest pain, palpitations and leg swelling.  ?Gastrointestinal: Denies nausea, vomiting, abdominal pain, diarrhea, constipation, blood in stool and abdominal distention.  ?Genitourinary: Denies dysuria, urgency, frequency, hematuria, flank pain and difficulty urinating.  ?Endocrine: Denies: hot or cold intolerance, sweats, changes in hair or nails, polyuria, polydipsia. ?Musculoskeletal: Denies myalgias, back pain, joint swelling, arthralgias and gait problem.  ?Skin: Denies pallor, rash and wound.  ?Neurological: Denies dizziness, seizures, syncope, weakness, light-headedness, numbness and headaches.  ?Hematological: Denies adenopathy. Easy bruising, personal or family bleeding history  ?Psychiatric/Behavioral: Denies suicidal ideation, mood changes, confusion, nervousness, sleep disturbance and agitation ? ? ?Past Medical History:  ?Diagnosis Date  ? Abnormal uterine bleeding (AUB)   ?  Hyperlipidemia   ? PCOS (polycystic ovarian syndrome)   ? Seasonal allergies   ? Wears glasses   ? ? ?Past Surgical History:  ?Procedure Laterality Date  ? HYSTEROSCOPY WITH D & C N/A 06/21/2020  ? Procedure: DILATATION AND CURETTAGE /HYSTEROSCOPY;  Surgeon: Salvadore Dom, MD;  Location: [facility];  Service: Gynecology;  Laterality: N/A;  ? REDUCTION MAMMAPLASTY Bilateral 2007  ? WISDOM TOOTH EXTRACTION  teen  ? ? ?Family History  ?Problem Relation Age of Onset  ? Ovarian cancer Paternal Grandmother   ? ? ?SOCIAL HX:  ? reports that she has never smoked. She has never used smokeless tobacco. She reports current alcohol use. She reports that she does not use drugs. ? ? ?Current Outpatient Medications:  ?  acetaminophen (TYLENOL) 500 MG tablet, Take 1,000 mg by mouth every 6 (six) hours as needed., Disp: , Rfl:  ?  cetirizine (ZYRTEC) 10 MG tablet, Take 10 mg by mouth daily as needed., Disp: , Rfl:  ?  medroxyPROGESTERone (PROVERA) 5 MG tablet, Take one tablet a day for 5 days every other month if no spontaneous menses., Disp: 15 tablet, Rfl: 1 ?  naproxen sodium (ALEVE) 220 MG tablet, Take 220 mg by mouth 2 (two) times daily as needed., Disp: , Rfl:  ?  nirmatrelvir/ritonavir EUA (PAXLOVID) 20 x 150 MG & 10 x 100MG  TABS, Take 3 tablets by mouth 2 (two) times daily for 5 days. (Take nirmatrelvir 150 mg two tablets twice daily for 5 days and ritonavir 100 mg one tablet twice daily for 5 days) Patient GFR is 114, Disp: 30 tablet, Rfl: 0 ? ?EXAM:  ? ?VITALS per patient if applicable: None reported ? ?GENERAL: alert, oriented, appears well and in no acute distress, sounds congested ? ?HEENT: atraumatic, conjunttiva clear, no obvious abnormalities on inspection of external nose and ears ? ?  NECK: normal movements of the head and neck ? ?LUNGS: on inspection no signs of respiratory distress, breathing rate appears normal, no obvious gross increased work of breathing, gasping or wheezing ? ?CV: no  obvious cyanosis ? ?MS: moves all visible extremities without noticeable abnormality ? ?PSYCH/NEURO: pleasant and cooperative, no obvious depression or anxiety, speech and thought processing grossly intact ? ?ASSESSMENT AND PLAN: ? ? ?COVID-19 ? - Plan: nirmatrelvir/ritonavir EUA (PAXLOVID) 20 x 150 MG & 10 x 100MG  TABS ?-She may also use OTC medications such as antihistamines, decongestants, pain relievers, guaifenesin. ?-We have reviewed quarantine period of 5 days. ?-We have discussed symptoms that would promote ED evaluation. ?-She knows to follow with [place] if symptoms fail to resolve. ? ? ? ?  ?I discussed the assessment and treatment plan with the patient. The patient was provided an opportunity to ask questions and all were answered. The patient agreed with the plan and demonstrated an understanding of the instructions. ?  ?The patient was advised to call back or seek an in-person evaluation if the symptoms worsen or if the condition fails to improve as anticipated. ? ? ? ?Lelon Frohlich, MD  ?[facility] ? ?

## 2021-11-13 NOTE — Progress Notes (Unsigned)
31 y.o. G0P0000 Single White or Caucasian Not Hispanic or Latino female here for annual exam.      No LMP recorded.          Sexually active: {yes no:314532}  The current method of family planning is {contraception:315051}.    Exercising: {yes no:314532}  {types:19826} Smoker:  {YES [id number]  Health Maintenance: Pap: 06/21/20 WNL HR HPV neg,  01/24/17 WNL  History of abnormal Pap:  no MMG:    None 2018 ultrasound of right breast  BMD:   None  Colonoscopy: none  TDaP:  2018 Gardasil: complete    reports that she has never smoked. She has never used smokeless tobacco. She reports current alcohol use. She reports that she does not use drugs.  Past Medical History:  Diagnosis Date   Abnormal uterine bleeding (AUB)    Hyperlipidemia    PCOS (polycystic ovarian syndrome)    Seasonal allergies    Wears glasses     Past Surgical History:  Procedure Laterality Date   HYSTEROSCOPY WITH D & C N/A 06/21/2020   Procedure: DILATATION AND CURETTAGE /HYSTEROSCOPY;  Surgeon: [name], MD;  Location: [facility] [place];  Service: Gynecology;  Laterality: N/A;   REDUCTION MAMMAPLASTY Bilateral 2007   WISDOM TOOTH EXTRACTION  teen    Current Outpatient Medications  Medication Sig Dispense Refill   acetaminophen (TYLENOL) 500 MG tablet Take 1,000 mg by mouth every 6 (six) hours as needed.     cetirizine (ZYRTEC) 10 MG tablet Take 10 mg by mouth daily as needed.     medroxyPROGESTERone (PROVERA) 5 MG tablet Take one tablet a day for 5 days every other month if no spontaneous menses. 15 tablet 1   naproxen sodium (ALEVE) 220 MG tablet Take 220 mg by mouth 2 (two) times daily as needed.     No current facility-administered medications for this visit.    Family History  Problem Relation Age of Onset   Ovarian cancer Paternal Grandmother     Review of Systems  Exam:   There were no vitals taken for this visit.  Weight change: @WEIGHTCHANGE @ Height:      Ht Readings from  Last 3 Encounters:  07/06/20 5\' 2"  (1.575 m)  06/21/20 5\' 2"  (1.575 m)  05/27/20 5\' 2"  (1.575 m)    General appearance: alert, cooperative and appears stated age Head: Normocephalic, without obvious abnormality, atraumatic Neck: no adenopathy, supple, symmetrical, trachea midline and thyroid {CHL AMB PHY EX THYROID NORM [phone]::"normal to inspection and palpation"} Lungs: clear to auscultation bilaterally Cardiovascular: regular rate and rhythm Breasts: {Exam; breast:13139::"normal appearance, no masses or tenderness"} Abdomen: soft, non-tender; non distended,  no masses,  no organomegaly Extremities: extremities normal, atraumatic, no cyanosis or edema Skin: Skin color, texture, turgor normal. No rashes or lesions Lymph nodes: Cervical, supraclavicular, and axillary nodes normal. No abnormal inguinal nodes palpated Neurologic: Grossly normal   Pelvic: External genitalia:  no lesions              Urethra:  normal appearing urethra with no masses, tenderness or lesions              Bartholins and Skenes: normal                 Vagina: normal appearing vagina with normal color and discharge, no lesions              Cervix: {CHL AMB PHY EX CERVIX NORM DEFAULT:[phone]::"no lesions"}  Bimanual Exam:  Uterus:  {CHL AMB PHY EX UTERUS NORM DEFAULT:[phone]::"normal size, contour, position, consistency, mobility, non-tender"}              Adnexa: {CHL AMB PHY EX ADNEXA NO MASS DEFAULT:[phone]::"no mass, fullness, tenderness"}               Rectovaginal: Confirms               Anus:  normal sphincter tone, no lesions  *** chaperoned for the exam.  A:  Well Woman with normal exam  P:

## 2021-11-15 ENCOUNTER — Ambulatory Visit (INDEPENDENT_AMBULATORY_CARE_PROVIDER_SITE_OTHER): Payer: Commercial Managed Care - HMO | Admitting: Obstetrics and Gynecology

## 2021-11-15 ENCOUNTER — Encounter: Payer: Self-pay | Admitting: Obstetrics and Gynecology

## 2021-11-15 VITALS — BP 126/74 | HR 60 | Ht 62.0 in | Wt 217.0 lb

## 2021-11-15 DIAGNOSIS — E282 Polycystic ovarian syndrome: Secondary | ICD-10-CM

## 2021-11-15 DIAGNOSIS — Z01419 Encounter for gynecological examination (general) (routine) without abnormal findings: Secondary | ICD-10-CM | POA: Diagnosis not present

## 2021-11-15 DIAGNOSIS — Z3009 Encounter for other general counseling and advice on contraception: Secondary | ICD-10-CM

## 2021-11-15 DIAGNOSIS — Z113 Encounter for screening for infections with a predominantly sexual mode of transmission: Secondary | ICD-10-CM | POA: Diagnosis not present

## 2021-11-15 DIAGNOSIS — Q513 Bicornate uterus: Secondary | ICD-10-CM | POA: Diagnosis not present

## 2021-11-15 DIAGNOSIS — Z6839 Body mass index (BMI) 39.0-39.9, adult: Secondary | ICD-10-CM

## 2021-11-15 MED ORDER — DROSPIRENONE-ETHINYL ESTRADIOL 3-0.02 MG PO TABS: 1.0000 | ORAL_TABLET | Freq: Every day | ORAL | 0 refills | Status: DC

## 2021-11-15 NOTE — Patient Instructions (Signed)
EXERCISE   We recommended that you start or continue a regular exercise program for good health. Physical activity is anything that gets your body moving, some is better than none. The CDC recommends 150 minutes per week of Moderate-Intensity Aerobic Activity and 2 or more days of Muscle Strengthening Activity.  Benefits of exercise are limitless: helps weight loss/weight maintenance, improves mood and energy, helps with depression and anxiety, improves sleep, tones and strengthens muscles, improves balance, improves bone density, protects from chronic conditions such as heart disease, high blood pressure and diabetes and so much more. To learn more visit: https://www.cdc.gov/physicalactivity/index.html  DIET: Good nutrition starts with a healthy diet of fruits, vegetables, whole grains, and lean protein sources. Drink plenty of water for hydration. Minimize empty calories, sodium, sweets. For more information about dietary recommendations visit: https://health.gov/our-work/nutrition-physical-activity/dietary-guidelines and https://www.myplate.gov/  ALCOHOL:  Women should limit their alcohol intake to no more than 7 drinks/beers/glasses of wine (combined, not each!) per week. Moderation of alcohol intake to this level decreases your risk of breast cancer and liver damage.  If you are concerned that you may have a problem, or your friends have told you they are concerned about your drinking, there are many resources to help. A well-known program that is free, effective, and available to all people all over the nation is Alcoholics Anonymous.  Check out this site to learn more: https://www.aa.org/   CALCIUM AND VITAMIN D:  Adequate intake of calcium and Vitamin D are recommended for bone health.  You should be getting between 1000-1200 mg of calcium and 800 units of Vitamin D daily between diet and supplements  PAP SMEARS:  Pap smears, to check for cervical cancer or precancers,  have traditionally been  done yearly, scientific advances have shown that most women can have pap smears less often.  However, every woman still should have a physical exam from her gynecologist every year. It will include a breast check, inspection of the vulva and vagina to check for abnormal growths or skin changes, a visual exam of the cervix, and then an exam to evaluate the size and shape of the uterus and ovaries. We will also provide age appropriate advice regarding health maintenance, like when you should have certain vaccines, screening for sexually transmitted diseases, bone density testing, colonoscopy, mammograms, etc.   MAMMOGRAMS:  All women over 40 years old should have a routine mammogram.   COLON CANCER SCREENING: Now recommend starting at age 45. At this time colonoscopy is not covered for routine screening until 50. There are take home tests that can be done between 45-49.   COLONOSCOPY:  Colonoscopy to screen for colon cancer is recommended for all women at age 50.  We know, you hate the idea of the prep.  We agree, BUT, having colon cancer and not knowing it is worse!!  Colon cancer so often starts as a polyp that can be seen and removed at colonscopy, which can quite literally save your life!  And if your first colonoscopy is normal and you have no family history of colon cancer, most women don't have to have it again for 10 years.  Once every ten years, you can do something that may end up saving your life, right?  We will be happy to help you get it scheduled when you are ready.  Be sure to check your insurance coverage so you understand how much it will cost.  It may be covered as a preventative service at no cost, but you should check   your particular policy.      Breast Self-Awareness Breast self-awareness means being familiar with how your breasts look and feel. It involves checking your breasts regularly and reporting any changes to your health care provider. Practicing breast self-awareness is  important. A change in your breasts can be a sign of a serious medical problem. Being familiar with how your breasts look and feel allows you to find any problems early, when treatment is more likely to be successful. All women should practice breast self-awareness, including women who have had breast implants. How to do a breast self-exam One way to learn what is normal for your breasts and whether your breasts are changing is to do a breast self-exam. To do a breast self-exam: Look for Changes  Remove all the clothing above your waist. Stand in front of a mirror in a room with good lighting. Put your hands on your hips. Push your hands firmly downward. Compare your breasts in the mirror. Look for differences between them (asymmetry), such as: Differences in shape. Differences in size. Puckers, dips, and bumps in one breast and not the other. Look at each breast for changes in your skin, such as: Redness. Scaly areas. Look for changes in your nipples, such as: Discharge. Bleeding. Dimpling. Redness. A change in position. Feel for Changes Carefully feel your breasts for lumps and changes. It is best to do this while lying on your back on the floor and again while sitting or standing in the shower or tub with soapy water on your skin. Feel each breast in the following way: Place the arm on the side of the breast you are examining above your head. Feel your breast with the other hand. Start in the nipple area and make  inch (2 cm) overlapping circles to feel your breast. Use the pads of your three middle fingers to do this. Apply light pressure, then medium pressure, then firm pressure. The light pressure will allow you to feel the tissue closest to the skin. The medium pressure will allow you to feel the tissue that is a little deeper. The firm pressure will allow you to feel the tissue close to the ribs. Continue the overlapping circles, moving downward over the breast until you feel your  ribs below your breast. Move one finger-width toward the center of the body. Continue to use the  inch (2 cm) overlapping circles to feel your breast as you move slowly up toward your collarbone. Continue the up and down exam using all three pressures until you reach your armpit.  Write Down What You Find  Write down what is normal for each breast and any changes that you find. Keep a written record with breast changes or normal findings for each breast. By writing this information down, you do not need to depend only on memory for size, tenderness, or location. Write down where you are in your menstrual cycle, if you are still menstruating. If you are having trouble noticing differences in your breasts, do not get discouraged. With time you will become more familiar with the variations in your breasts and more comfortable with the exam. How often should I examine my breasts? Examine your breasts every month. If you are breastfeeding, the best time to examine your breasts is after a feeding or after using a breast pump. If you menstruate, the best time to examine your breasts is 5-7 days after your period is over. During your period, your breasts are lumpier, and it may be more   difficult to notice changes. When should I see my health care provider? See your health care provider if you notice: A change in shape or size of your breasts or nipples. A change in the skin of your breast or nipples, such as a reddened or scaly area. Unusual discharge from your nipples. A lump or thick area that was not there before. Pain in your breasts. Anything that concerns you. Oral Contraception Information Oral contraceptive pills (OCPs) are medicines taken by mouth to prevent pregnancy. They work by: Preventing the ovaries from releasing eggs. Thickening mucus in the lower part of the uterus (cervix). This prevents sperm from entering the uterus. Thinning the lining of the uterus (endometrium). This prevents a  fertilized egg from attaching to the endometrium. OCPs are highly effective when taken exactly as prescribed. However, OCPs do not prevent STIs (sexually transmitted infections). Using condoms while on an OCP can help prevent STIs. What happens before starting OCPs? Before you start taking OCPs: You may have a physical exam, blood test, and Pap test. Your health care provider will make sure you are a good candidate for oral contraception. OCPs are not a good option for certain women, such as: Women who smoke and are older than age 35. Women who have or have had certain conditions, such as: A history of high blood pressure. Deep vein thrombosis. Pulmonary embolism. Stroke. Cardiovascular disease. Peripheral vascular disease. Ask your health care provider about the possible side effects of the OCP you may be prescribed. Be aware that it can take 2-3 months for your body to adjust to changes in hormone levels. Types of oral contraception  Birth control pills contain the hormones estrogen and progestin (synthetic progesterone) or progestin only. The combination pill This type of pill contains estrogen and progestin hormones. Conventional contraception pills come in packs of 21 or 28 pills. Some packs with 28-day pills contain estrogen and progestin for the first 21-24 days. Hormone-free tablets, called placebos, are taken for the final 4-7 days. You should have menstrual bleeding during the time you take the placebos. In packs with 21 tablets, you take no pills for 7 days. Menstrual bleeding occurs during these days. (Some people prefer taking a pill for 28 days to help establish a routine). Extended-interval contraception pills come in packs of 91 pills. The first 84 tablets have both estrogen and progestin. The last 7 pills are placebos. Menstrual bleeding occurs during the placebo days. With this schedule, menstrual bleeding happens once every 3 months. Continuous contraception pills come in  packs of 28 pills. All pills in the pack contain estrogen and progestin. With this schedule, regular menstrual bleeding does not happen, but there may be spotting or irregular bleeding. Progestin-only pills This type of pill is often called the mini-pill and contains the progestin hormone only. It comes in packs of 28 pills. In some packs, the last 4 pills are placebos. The pill must be taken at the same time every day. This is very important to prevent pregnancy. Menstrual bleeding may not be regular or predictable. What are the advantages? Oral contraception provides reliable and continuous contraception if taken as directed. It may treat or decrease symptoms of: Menstrual period cramps. Irregular menstrual cycle or bleeding. Heavy menstrual flow. Abnormal uterine bleeding. Acne, depending on the type of pill. Polycystic ovarian syndrome (POS). Endometriosis. Iron deficiency anemia. Premenstrual symptoms, including severe irritability, depression, or anxiety. It also may: Reduce the risk of endometrial and ovarian cancer. Be used as emergency contraception. Prevent ectopic   pregnancies and infections of the fallopian tubes. What can make OCPs less effective? OCPs may be less effective if: You forget to take the pill every day. For progestin-only pills, it is especially important to take the pill at the same time each day. Even taking it 3 hours late can increase the risk of pregnancy. You have a stomach or intestinal disease that reduces your body's ability to absorb the pill. You take OCPs with other medicines that make OCPs less effective, such as antibiotics, certain HIV medicines, and some seizure medicines. You take expired OCPs. You forget to restart the pill after 7 days of not taking it. This refers to the packs of 21 pills. What are the side effects and risks? OCPs can sometimes cause side effects, such as: Headache. Depression. Trouble sleeping. Nausea and vomiting. Breast  tenderness. Irregular bleeding or spotting during the first several months. Bloating or fluid retention. Increase in blood pressure. Combination pills may slightly increase the risk of: Blood clots. Heart attack. Stroke. Follow these instructions at home: Follow instructions from your health care provider about how to start taking your first cycle of OCPs. Depending on when you start the pill, you may need to use a backup form of birth control, such as condoms, during the first week. Make sure you know what steps to take if you forget to take the pill. Summary Oral contraceptive pills (OCPs) are medicines taken by mouth to prevent pregnancy. They are highly effective when taken exactly as prescribed. OCPs contain a combination of the hormones estrogen and progestin (synthetic progesterone) or progestin only. Before you start taking the pill, you may have a physical exam, blood test, and Pap test. Your health care provider will make sure you are a good candidate for oral contraception. The combination pill may come in a 21-day pack, a 28-day pack, or a 91-day pack. Progestin-only pills come in packs of 28 pills. OCPs can sometimes cause side effects, such as headache, nausea, breast tenderness, or irregular bleeding. This information is not intended to replace advice given to you by your health care provider. Make sure you discuss any questions you have with your health care provider. Document Revised: 01/01/2020 Document Reviewed: 12/10/2019 Elsevier Patient Education  2023 [employer]  

## 2021-11-16 LAB — SURESWAB CT/NG/T. VAGINALIS
C. trachomatis RNA, TMA: NOT DETECTED
N. gonorrhoeae RNA, TMA: NOT DETECTED
Trichomonas vaginalis RNA: NOT DETECTED

## 2021-11-16 LAB — HIV ANTIBODY (ROUTINE TESTING W REFLEX): HIV 1&2 Ab, 4th Generation: NONREACTIVE

## 2021-11-16 LAB — HEPATITIS C ANTIBODY: Hepatitis C Ab: NONREACTIVE

## 2021-11-16 LAB — RPR: RPR Ser Ql: NONREACTIVE

## 2021-12-04 ENCOUNTER — Ambulatory Visit: Payer: Commercial Managed Care - HMO | Admitting: Family Medicine

## 2021-12-04 VITALS — BP 120/82 | HR 88 | Temp 99.0°F | Wt 213.6 lb

## 2021-12-04 DIAGNOSIS — R5383 Other fatigue: Secondary | ICD-10-CM | POA: Diagnosis not present

## 2021-12-04 DIAGNOSIS — J029 Acute pharyngitis, unspecified: Secondary | ICD-10-CM

## 2021-12-04 DIAGNOSIS — J358 Other chronic diseases of tonsils and adenoids: Secondary | ICD-10-CM

## 2021-12-04 DIAGNOSIS — L729 Follicular cyst of the skin and subcutaneous tissue, unspecified: Secondary | ICD-10-CM | POA: Diagnosis not present

## 2021-12-04 LAB — POCT MONO (EPSTEIN BARR VIRUS): Mono, POC: NEGATIVE

## 2021-12-04 LAB — POC COVID19 BINAXNOW: SARS Coronavirus 2 Ag: NEGATIVE

## 2021-12-04 LAB — POC INFLUENZA A&B (BINAX/QUICKVUE)
Influenza A, POC: NEGATIVE
Influenza B, POC: NEGATIVE

## 2021-12-04 LAB — POCT RAPID STREP A (OFFICE): Rapid Strep A Screen: NEGATIVE

## 2021-12-04 NOTE — Progress Notes (Signed)
Subjective:    Patient ID: Krystal Leonard, female    DOB: [birth year], 31 y.o.   MRN: [id number]  Chief Complaint  Patient presents with   Sore Throat    Noticed it last Monday, was just pain. Alternating advil and tylenol, did not get physically tired until Friday. States just fatigue, pain and swelling on right side.    HPI Patient was seen today for ongoing concern.  Patient with sore throat and tonsil stones x7 days.  Also notes increased fatigue.  Pt concerned about symptoms as endorses sexual activity the wknd prior to symptoms starting.  Pt had negative STI testing 11/15/21 with OB/Gyn.  Pt just started having sex.  Pt and partner used condoms.  Pt noticed a bump near clitoris, that is tender.  Pt denies vaginal d/c different from her normal, vaginal irritation.  Past Medical History:  Diagnosis Date   Abnormal uterine bleeding (AUB)    Hyperlipidemia    Migraine without aura    PCOS (polycystic ovarian syndrome)    Seasonal allergies    Wears glasses     Allergies  Allergen Reactions   Latex Itching    Per pt the area with redness and numbness of hands when wearing latex gloves   Lactose Intolerance (Gi)     ROS General: Denies fever, chills, night sweats, changes in weight, changes in appetite +fatigue HEENT: Denies headaches, ear pain, changes in vision, rhinorrhea +sore throat CV: Denies CP, palpitations, SOB, orthopnea Pulm: Denies SOB, cough, wheezing GI: Denies abdominal pain, nausea, vomiting, diarrhea, constipation GU: Denies dysuria, hematuria, frequency, vaginal discharge Msk: Denies muscle cramps, joint pains Neuro: Denies weakness, numbness, tingling Skin: Denies rashes, bruising  +bump near labia Psych: Denies depression, anxiety, hallucinations     Objective:    Blood pressure 120/82, pulse 88, temperature 99 F (37.2 C), temperature source Oral, weight 213 lb 9.6 oz (96.9 kg), last menstrual period 10/21/2021, SpO2 99 %.  Gen. Pleasant,  well-nourished, in no distress, normal affect   HEENT: [place]/AT, face symmetric, conjunctiva clear, no scleral icterus, PERRLA, EOMI, nares patent without drainage, pharynx with erythema and tonsil stones, no exudate. Neck: No JVD, no thyromegaly. Lungs: no accessory muscle use, CTAB, no wheezes or rales Cardiovascular: RRR, no peripheral edema GU: normal external female genitalia, folliculitis/hair bumps on mons pubis, a 4 mm round mobile firm pustule to the right of clitoris with tenderness, without out erythema or drainage.  Thick whitish d/c noted at introitus without irritation or lesions. Neuro:  A&Ox3, CN II-XII intact, normal gait Skin:  Warm, no lesions/ rash   Wt Readings from Last 3 Encounters:  11/15/21 217 lb (98.4 kg)  06/22/21 219 lb 14.4 oz (99.7 kg)  07/26/20 225 lb (102.1 kg)    Lab Results  Component Value Date   WBC 9.9 06/22/2021   HGB 15.8 (H) 06/22/2021   HCT 46.4 (H) 06/22/2021   PLT 297.0 06/22/2021   GLUCOSE 83 06/22/2021   CHOL 197 05/27/2020   TRIG 319 (H) 05/27/2020   HDL 36 (L) 05/27/2020   LDLCALC 115 (H) 05/27/2020   ALT 12 06/22/2021   AST 14 06/22/2021   NA 138 06/22/2021   K 4.0 06/22/2021   CL 101 06/22/2021   CREATININE 0.70 06/22/2021   BUN 14 06/22/2021   CO2 29 06/22/2021   TSH 2.13 06/22/2021   HGBA1C 4.8 05/27/2020    Assessment/Plan:  Sore Throat -Rapid strep, flu, COVID testing negative - Plan: POC Rapid Strep A, POC COVID-19,  POC Influenza A&B(BINAX/QUICKVUE), POC Mono (Epstein Barr Virus)  Fatigue, unspecified type -Monospot negative  - Plan: POC Mono (Epstein Barr Virus)  Tonsil stone  Cyst of skin -medial to R labia minora -apply warm compresses  Throat culture obtained as point-of-care testing negative.  Patient advised on supportive care including gargling with warm salt water ,Chloraseptic spray, Tylenol or ibuprofen as needed for pain/discomfort, rest, hydration ,etc. given handouts .given precautions.    F/u  prn  Abbe Amsterdam, MD

## 2021-12-04 NOTE — Addendum Note (Signed)
Addended by: Elwin Mocha on: 12/04/2021 04:23 PM   Modules accepted: Orders

## 2021-12-05 ENCOUNTER — Ambulatory Visit: Payer: Commercial Managed Care - HMO | Admitting: Internal Medicine

## 2021-12-06 LAB — CULTURE, GROUP A STREP
MICRO NUMBER:: 13807728
SPECIMEN QUALITY:: ADEQUATE

## 2021-12-26 ENCOUNTER — Telehealth (INDEPENDENT_AMBULATORY_CARE_PROVIDER_SITE_OTHER): Payer: Commercial Managed Care - HMO | Admitting: Internal Medicine

## 2021-12-26 VITALS — Wt 210.0 lb

## 2021-12-26 DIAGNOSIS — R5382 Chronic fatigue, unspecified: Secondary | ICD-10-CM

## 2021-12-26 DIAGNOSIS — R221 Localized swelling, mass and lump, neck: Secondary | ICD-10-CM | POA: Diagnosis not present

## 2021-12-26 DIAGNOSIS — G4700 Insomnia, unspecified: Secondary | ICD-10-CM

## 2021-12-26 NOTE — Progress Notes (Signed)
Virtual Visit via Video Note  I connected with Krystal Leonard on 12/26/21 at  4:00 PM EDT by a video enabled telemedicine application and verified that I am speaking with the correct person using two identifiers.  Location patient: home Location provider: work office Persons participating in the virtual visit: patient, provider  I discussed the limitations of evaluation and management by telemedicine and the availability of in person appointments. The patient expressed understanding and agreed to proceed.   HPI: She has scheduled this visit to discuss some concerns regarding her thyroid.  She has been having throat pain with an intense feeling of fatigue and brain fog.  She states her mother was diagnosed with thyroiditis and Graves' disease and this was how her symptoms started.  Additionally her maternal grandmother had thyroid cancer and so she is concerned about thyroid disease.  She is having a hard time sleeping and is concerned about the possibility of sleep apnea.   ROS: Constitutional: Denies fever, chills, diaphoresis. HEENT: Denies photophobia, eye pain, redness, mouth sores, trouble swallowing, neck pain, neck stiffness and tinnitus.   Respiratory: Denies SOB, DOE, cough, chest tightness,  and wheezing.   Cardiovascular: Denies chest pain, palpitations and leg swelling.  Gastrointestinal: Denies nausea, vomiting, abdominal pain, diarrhea, constipation, blood in stool and abdominal distention.  Genitourinary: Denies dysuria, urgency, frequency, hematuria, flank pain and difficulty urinating.  Endocrine: Denies: hot or cold intolerance, sweats, changes in hair or nails, polyuria, polydipsia. Musculoskeletal: Denies myalgias, back pain, joint swelling, arthralgias and gait problem.  Skin: Denies pallor, rash and wound.  Neurological: Denies dizziness, seizures, syncope, weakness, light-headedness, numbness and headaches.  Hematological: Denies adenopathy. Easy  bruising, personal or family bleeding history  Psychiatric/Behavioral: Denies suicidal ideation, mood changes, confusion, nervousness and agitation   Past Medical History:  Diagnosis Date   Abnormal uterine bleeding (AUB)    Hyperlipidemia    Migraine without aura    PCOS (polycystic ovarian syndrome)    Seasonal allergies    Wears glasses     Past Surgical History:  Procedure Laterality Date   HYSTEROSCOPY WITH D & C N/A 06/21/2020   Procedure: DILATATION AND CURETTAGE /HYSTEROSCOPY;  Surgeon: [name], MD;  Location: [facility] [place];  Service: Gynecology;  Laterality: N/A;   REDUCTION MAMMAPLASTY Bilateral 2007   WISDOM TOOTH EXTRACTION  teen    Family History  Problem Relation Age of Onset   Ovarian cancer Paternal Grandmother     SOCIAL HX:   reports that she has never smoked. She has never used smokeless tobacco. She reports current alcohol use. She reports that she does not use drugs.   Current Outpatient Medications:    acetaminophen (TYLENOL) 500 MG tablet, Take 1,000 mg by mouth every 6 (six) hours as needed., Disp: , Rfl:    cetirizine (ZYRTEC) 10 MG tablet, Take 10 mg by mouth daily as needed., Disp: , Rfl:    drospirenone-ethinyl estradiol (YAZ) 3-0.02 MG tablet, Take 1 tablet by mouth daily., Disp: 84 tablet, Rfl: 0   naproxen sodium (ALEVE) 220 MG tablet, Take 220 mg by mouth 2 (two) times daily as needed., Disp: , Rfl:   EXAM:   VITALS per patient if applicable: None reported  GENERAL: alert, oriented, appears well and in no acute distress  HEENT: atraumatic, conjunttiva clear, no obvious abnormalities on inspection of external nose and ears  NECK: normal movements of the head and neck  LUNGS: on inspection no signs of respiratory distress, breathing rate  appears normal, no obvious gross increased work of breathing, gasping or wheezing  CV: no obvious cyanosis  MS: moves all visible extremities without noticeable  abnormality  PSYCH/NEURO: pleasant and cooperative, no obvious depression or anxiety, speech and thought processing grossly intact  ASSESSMENT AND PLAN:   Chronic fatigue - Plan: Ambulatory referral to Neurology  Insomnia, unspecified type - Plan: Ambulatory referral to Neurology  Neck fullness - Plan: US THYROID, TSH, T4, free, T3, free  -Check thyroid function studies and ultrasound given her symptoms, family history of Graves' disease and thyroid cancer as well as her perceived neck fullness. -I feel it is reasonable to rule out sleep apnea given her symptoms, refer to neurology.   I discussed the assessment and treatment plan with the patient. The patient was provided an opportunity to ask questions and all were answered. The patient agreed with the plan and demonstrated an understanding of the instructions.   The patient was advised to call back or seek an in-person evaluation if the symptoms worsen or if the condition fails to improve as anticipated.    [name], MD  [facility]

## 2021-12-28 ENCOUNTER — Other Ambulatory Visit (INDEPENDENT_AMBULATORY_CARE_PROVIDER_SITE_OTHER): Payer: Commercial Managed Care - HMO

## 2021-12-28 ENCOUNTER — Ambulatory Visit (HOSPITAL_BASED_OUTPATIENT_CLINIC_OR_DEPARTMENT_OTHER)
Admission: RE | Admit: 2021-12-28 | Discharge: 2021-12-28 | Disposition: A | Discharge status: Home / Self Care | Payer: Commercial Managed Care - HMO | Source: Ambulatory Visit | Attending: Internal Medicine | Admitting: Internal Medicine

## 2021-12-28 DIAGNOSIS — R221 Localized swelling, mass and lump, neck: Secondary | ICD-10-CM | POA: Insufficient documentation

## 2021-12-28 LAB — TSH: TSH: 1.3 u[IU]/mL (ref 0.35–5.50)

## 2021-12-28 LAB — T4, FREE: Free T4: 0.68 ng/dL (ref 0.60–1.60)

## 2021-12-28 LAB — T3, FREE: T3, Free: 3.1 pg/mL (ref 2.3–4.2)

## 2022-01-05 ENCOUNTER — Encounter: Payer: Self-pay | Admitting: Obstetrics and Gynecology

## 2022-01-29 ENCOUNTER — Other Ambulatory Visit: Payer: Self-pay | Admitting: Obstetrics and Gynecology

## 2022-01-29 DIAGNOSIS — Z3009 Encounter for other general counseling and advice on contraception: Secondary | ICD-10-CM

## 2022-01-29 NOTE — Telephone Encounter (Signed)
Last annual exam 11/2021

## 2022-01-31 ENCOUNTER — Ambulatory Visit (INDEPENDENT_AMBULATORY_CARE_PROVIDER_SITE_OTHER): Payer: Commercial Managed Care - HMO | Admitting: Neurology

## 2022-01-31 ENCOUNTER — Encounter: Payer: Self-pay | Admitting: Neurology

## 2022-01-31 VITALS — BP 130/95 | HR 88 | Ht 62.0 in | Wt 213.5 lb

## 2022-01-31 DIAGNOSIS — G4719 Other hypersomnia: Secondary | ICD-10-CM

## 2022-01-31 DIAGNOSIS — R0683 Snoring: Secondary | ICD-10-CM | POA: Diagnosis not present

## 2022-01-31 DIAGNOSIS — Z820 Family history of epilepsy and other diseases of the nervous system: Secondary | ICD-10-CM

## 2022-01-31 DIAGNOSIS — E669 Obesity, unspecified: Secondary | ICD-10-CM | POA: Diagnosis not present

## 2022-01-31 DIAGNOSIS — R519 Headache, unspecified: Secondary | ICD-10-CM | POA: Diagnosis not present

## 2022-01-31 DIAGNOSIS — R030 Elevated blood-pressure reading, without diagnosis of hypertension: Secondary | ICD-10-CM

## 2022-01-31 NOTE — Patient Instructions (Signed)

## 2022-01-31 NOTE — Progress Notes (Signed)
Subjective:    Patient ID: Krystal Leonard is a 31 y.o. female.  HPI    Star Age, MD, PhD [facility] [address] [place], [place] [address]  Dear Dr. Isaac Bliss,  I saw your patient, Krystal Leonard, upon your kind request in my sleep clinic today for initial consultation of her sleep disorder, in particular, concern for underlying obstructive sleep apnea.  The patient is unaccompanied today.  As you know, Krystal Leonard is a 31 year old female with an underlying medical history of hyperlipidemia, migraine headaches, PCOS, allergies, abnormal uterine bleeding, and obesity, who reports snoring and excessive daytime somnolence.  I reviewed your video visit note from 12/26/2021.  Her Epworth sleepiness score is 19 out of 24, fatigue severity score is 48 out of 63.  She has had trouble maintaining sleep.  She has tried melatonin as well as CBD Gummies.  She has tried leftover Flexeril that she had from a very old prescription.  She goes to bed around 9:45 PM, may be asleep by 10 PM, she gets up early to exercise in the mornings, usually around 4:40 AM.  She goes to the gym up to 6 days out of the week.  She has occasionally woken up with a headache.  She has significant work-related stress, she runs a nonprofit.  She drinks caffeine in the form of coffee, 1 large cup per day, she is non-smoker, she drinks alcohol occasionally.  She is single and lives alone.  She has 1 dog in the household.  She has a family history of sleep apnea, her mom has a CPAP machine.  She has maternal cousins with sleep apnea as well.  She has had weight fluctuation.  She is working on weight loss.  Her Past Medical History Is Significant For: Past Medical History:  Diagnosis Date   Abnormal uterine bleeding (AUB)    Hyperlipidemia    Migraine without aura    PCOS (polycystic ovarian syndrome)    Seasonal allergies    Wears glasses     Her Past Surgical  History Is Significant For: Past Surgical History:  Procedure Laterality Date   HYSTEROSCOPY WITH D & C N/A 06/21/2020   Procedure: DILATATION AND CURETTAGE /HYSTEROSCOPY;  Surgeon: Salvadore Dom, MD;  Location: [place];  Service: Gynecology;  Laterality: N/A;   REDUCTION MAMMAPLASTY Bilateral 2007   WISDOM TOOTH EXTRACTION  teen    Her Family History Is Significant For: Family History  Problem Relation Age of Onset   Sleep apnea Mother    Ovarian cancer Paternal Grandmother     Her Social History Is Significant For: Social History   Socioeconomic History   Marital status: Single    Spouse name: Not on file   Number of children: Not on file   Years of education: Not on file   Highest education level: Bachelor's degree (e.g., BA, AB, BS)  Occupational History   Not on file  Tobacco Use   Smoking status: Never   Smokeless tobacco: Never  Vaping Use   Vaping Use: Never used  Substance and Sexual Activity   Alcohol use: Yes    Comment: SOCIAL   Drug use: Never   Sexual activity: Not on file  Other Topics Concern   Not on file  Social History Narrative   Not on file   Social Determinants of Health   Financial Resource Strain: Low Risk  (06/21/2021)   Overall Financial Resource Strain (CARDIA)  Difficulty of Paying Living Expenses: Not very hard  Food Insecurity: No Food Insecurity (06/21/2021)   Hunger Vital Sign    Worried About Running Out of Food in the Last Year: Never true    Ran Out of Food in the Last Year: Never true  Transportation Needs: No Transportation Needs (06/21/2021)   PRAPARE - Administrator, Civil Service (Medical): No    Lack of Transportation (Non-Medical): No  Physical Activity: Sufficiently Active (06/21/2021)   Exercise Vital Sign    Days of Exercise per Week: 5 days    Minutes of Exercise per Session: 30 min  Stress: Stress Concern Present (06/21/2021)   Krystal Leonard [employer] Occupational [employer] - Occupational  Stress Questionnaire    Feeling of Stress : Rather much  Social Connections: Socially Isolated (06/21/2021)   Social Connection and Isolation Panel [NHANES]    Frequency of Communication with Friends and Family: More than three times a week    Frequency of Social Gatherings with Friends and Family: Three times a week    Attends Religious Services: Never    Active Member of Clubs or Organizations: No    Attends Engineer, structural: Not on file    Marital Status: Never married    Her Allergies Are:  Allergies  Allergen Reactions   Latex Itching    Per pt the area with redness and numbness of hands when wearing latex gloves   Lactose Intolerance (Gi)   :   Her Current Medications Are:  Outpatient Encounter Medications as of 01/31/2022  Medication Sig   acetaminophen (TYLENOL) 500 MG tablet Take 1,000 mg by mouth as needed.   cetirizine (ZYRTEC) 10 MG tablet Take 10 mg by mouth as needed for allergies.   ibuprofen (ADVIL) 800 MG tablet Take 800 mg by mouth as needed for headache.   naproxen sodium (ALEVE) 220 MG tablet Take 220 mg by mouth as needed.   NIKKI 3-0.02 MG tablet TAKE 1 TABLET BY MOUTH EVERY DAY   No facility-administered encounter medications on file as of 01/31/2022.  :   Review of Systems:  Out of a complete 14 point review of systems, all are reviewed and negative with the exception of these symptoms as listed below:   Review of Systems  Neurological:        Pt here for sleep consult  Pt snores,headaches,fatigue  Pt denies hypertension,sleep study,CPAP machine      ESS:19  FSS:48    Objective:  Neurological Exam  Physical Exam Physical Examination:   Vitals:   01/31/22 1412  BP: (!) 130/95  Pulse: 88    General Examination: The patient is a very pleasant 31 y.o. female in no acute distress. She appears well-developed and well-nourished and well groomed.   HEENT: Normocephalic, atraumatic, pupils are equal, round and reactive to light,  extraocular tracking is good without limitation to gaze excursion or nystagmus noted. Hearing is grossly intact. Face is symmetric with normal facial animation.  Rash noted in the malar area primarily but also forehead.  Speech is clear with no dysarthria noted. There is no hypophonia. There is no lip, neck/head, jaw or voice tremor. Neck is supple with full range of passive and active motion. There are no carotid bruits on auscultation. Oropharynx exam reveals: mild mouth dryness, good dental hygiene and moderate airway crowding, due to continued size of about 2-3+, Mallampati class I.  Neck circumference of 15-3/8 inches.  Minimal overbite.  Tongue protrudes centrally and palate elevates  symmetrically.  Chest: Clear to auscultation without wheezing, rhonchi or crackles noted.  Heart: S1+S2+0, regular and normal without murmurs, rubs or gallops noted.   Abdomen: Soft, non-tender and non-distended.  Extremities: There is no pitting edema in the distal lower extremities bilaterally.   Skin: Warm and dry with a fairly prominent facial rash noted, particularly in the malar area bilaterally, right a little worse than left.  This is not a new rash but has flared up per patient.   Musculoskeletal: exam reveals no obvious joint deformities.   Neurologically:  Mental status: The patient is awake, alert and oriented in all 4 spheres. Her immediate and remote memory, attention, language skills and fund of knowledge are appropriate. There is no evidence of aphasia, agnosia, apraxia or anomia. Speech is clear with normal prosody and enunciation. Thought process is linear. Mood is normal and affect is normal.  Cranial nerves II - XII are as described above under HEENT exam.  Motor exam: Normal bulk, strength and tone is noted. There is no obvious action or resting tremor.  Fine motor skills and coordination: grossly intact.  Cerebellar testing: No dysmetria or intention tremor. There is no truncal or gait  ataxia.  Sensory exam: intact to light touch in the upper and lower extremities.  Gait, station and balance: She stands easily. No veering to one side is noted. No leaning to one side is noted. Posture is age-appropriate and stance is narrow based. Gait shows normal stride length and normal pace. No problems turning are noted.   Assessment and Plan:  In summary, Naeema Wyche is a very pleasant 31 y.o.-year old female with an underlying medical history of hyperlipidemia, migraine headaches, PCOS, allergies, abnormal uterine bleeding, and obesity, whose history and physical exam are concerning for sleep disordered breathing, supporting a current working diagnosis of unspecified sleep apnea, with the main differential diagnoses of obstructive sleep apnea (OSA) versus upper airway resistance syndrome (UARS) versus central sleep apnea (CSA), or mixed sleep apnea. A laboratory attended sleep study is considered gold standard for evaluation of sleep disordered breathing and is recommended at this time and clinically justified.   I had a long chat with the patient about my findings and the diagnosis of sleep apnea, particularly OSA, its prognosis and treatment options. We talked about medical/conservative treatments, surgical interventions and non-pharmacological approaches for symptom control. I explained, in particular, the risks and ramifications of untreated moderate to severe OSA, especially with respect to developing cardiovascular disease down the road, including congestive heart failure (CHF), difficult to treat hypertension, cardiac arrhythmias (particularly A-fib), neurovascular complications including TIA, stroke and dementia. Even type 2 diabetes has, in part, been linked to untreated OSA. Symptoms of untreated OSA may include (but may not be limited to) daytime sleepiness, nocturia (i.e. frequent nighttime urination), memory problems, mood irritability and suboptimally controlled or worsening  mood disorder such as depression and/or anxiety, lack of energy, lack of motivation, physical discomfort, as well as recurrent headaches, especially morning or nocturnal headaches. We talked about the importance of maintaining a healthy lifestyle and striving for healthy weight.  In addition, we talked about the importance of striving for and maintaining good sleep hygiene. I recommended the following at this time: sleep study.    She is advised to follow-up with dermatology regarding her facial rash which flares up from time to time.  It may be worth checking her for autoimmune antibody such as ANA.  She is encouraged to follow-up with you as scheduled and  make sure she has had her vitamin D and B12 checked as well.  I outlined the differences between a laboratory attended sleep study which is considered more comprehensive and accurate over the option of a home sleep test (HST); the latter may lead to underestimation of sleep disordered breathing in some instances and does not help with diagnosing upper airway resistance syndrome and is not accurate enough to diagnose primary central sleep apnea typically. I explained the different sleep test procedures to the patient in detail and also outlined possible surgical and non-surgical treatment options of OSA, including the use of a pressure airway pressure (PAP) device (ie CPAP, AutoPAP/APAP or BiPAP in certain circumstances), a custom-made dental device (aka oral appliance, which would require a referral to a specialist dentist or orthodontist typically, and is generally speaking not considered a good choice for patients with full dentures or edentulous state), upper airway surgical options, such as traditional UPPP (which is not considered a first-line treatment) or the Inspire device (hypoglossal nerve stimulator, which would involve a referral for consultation with an ENT surgeon, after careful selection, following inclusion criteria). I explained the PAP  treatment option to the patient in detail, as this is generally considered first-line treatment.  The patient indicated that she would be willing to try PAP therapy, if the need arises. I explained the importance of being compliant with PAP treatment, not only for insurance purposes but primarily to improve patient's symptoms symptoms, and for the patient's long term health benefit, including to reduce Her cardiovascular risks longer-term.    We will pick up our discussion about the next steps and treatment options after testing.  We will keep her posted as to the test results by phone call and/or MyChart messaging where possible.  We will plan to follow-up in sleep clinic accordingly as well.  I answered all her questions today and the patient was in agreement.   I encouraged her to call with any interim questions, concerns, problems or updates or email [place] through [employer].  Generally speaking, sleep test authorizations may take up to 2 weeks, sometimes less, sometimes longer, the patient is encouraged to get in touch with [place] if they do not hear back from the sleep lab staff directly within the next 2 weeks.  Thank you very much for allowing me to participate in the care of this nice patient. If I can be of any further assistance to you please do not hesitate to call me at [phone].  Sincerely,   Star Age, MD, PhD

## 2022-02-26 ENCOUNTER — Telehealth: Payer: Self-pay | Admitting: Neurology

## 2022-02-26 NOTE — Telephone Encounter (Signed)
[employer] pending faxed notes  

## 2022-03-20 NOTE — Telephone Encounter (Signed)
[employer] denied the NPSG.  HST- no auth req via [employer] ref # [id number]   Patient is scheduled at [facility] for 04/24/22 at 3:30 pm.  Mailed packet to the patient.

## 2022-04-25 ENCOUNTER — Ambulatory Visit: Payer: Commercial Managed Care - HMO | Admitting: Neurology

## 2022-04-25 DIAGNOSIS — R0683 Snoring: Secondary | ICD-10-CM

## 2022-04-25 DIAGNOSIS — E669 Obesity, unspecified: Secondary | ICD-10-CM

## 2022-04-25 DIAGNOSIS — G4733 Obstructive sleep apnea (adult) (pediatric): Secondary | ICD-10-CM | POA: Diagnosis not present

## 2022-04-25 DIAGNOSIS — Z820 Family history of epilepsy and other diseases of the nervous system: Secondary | ICD-10-CM

## 2022-04-25 DIAGNOSIS — R519 Headache, unspecified: Secondary | ICD-10-CM

## 2022-04-25 DIAGNOSIS — R030 Elevated blood-pressure reading, without diagnosis of hypertension: Secondary | ICD-10-CM

## 2022-04-25 DIAGNOSIS — G4719 Other hypersomnia: Secondary | ICD-10-CM

## 2022-04-26 NOTE — Progress Notes (Signed)
See procedure note.

## 2022-04-27 NOTE — Addendum Note (Signed)
Addended by: Star Age on: 04/27/2022 02:12 PM   Modules accepted: Orders

## 2022-04-27 NOTE — Procedures (Signed)
   [facility]  HOME SLEEP TEST (Watch PAT) REPORT  STUDY DATE: 04/25/22  DOB: 10/05/1990  MRN: [id number]  ORDERING CLINICIAN: Star Age, MD, PhD   REFERRING CLINICIAN: Isaac Bliss, Rayford Halsted, MD   CLINICAL INFORMATION/HISTORY: 32 year old female with an underlying medical history of hyperlipidemia, migraine headaches, PCOS, allergies, abnormal uterine bleeding, and obesity, who reports snoring and excessive daytime somnolence.    Epworth sleepiness score: 19/24.  BMI: 39 kg/m  FINDINGS:   Sleep Summary:   Total Recording Time (hours, min): 7 hours, 22 min  Total Sleep Time (hours, min):  6 hours, 12 min  Percent REM (%):    4.4%   Respiratory Indices:   Calculated pAHI (per hour):  15.9/hour         REM pAHI:    n/a       NREM pAHI: 15.9/hour  Central pAHI: 1.3/hour  Oxygen Saturation Statistics:    Oxygen Saturation (%) Mean: 94%   Minimum oxygen saturation (%):                 86%   O2 Saturation Range (%): 86 - 99%    O2 Saturation (minutes) <=88%: 0.6 min  Pulse Rate Statistics:   Pulse Mean (bpm):    77/min    Pulse Range (57 - 120/min)   IMPRESSION: OSA (obstructive sleep apnea)   RECOMMENDATION:  This home sleep test demonstrates moderate obstructive sleep apnea with a total AHI of 15.9/hour and O2 nadir of 86%.  Intermittent mild to moderate snoring was detected. Treatment with a positive airway pressure (PAP) device is recommended. The patient will be advised to proceed with an autoPAP titration/trial at home for now. A full night titration study may be considered to optimize treatment settings, monitor proper oxygen saturations and aid with improvement of tolerance and adherence, if needed down the road. Alternative treatment options may include a dental device through dentistry or orthodontics in selected patients or Inspire (hypoglossal nerve stimulator) in carefully selected patients (meeting inclusion criteria).   Concomitant weight loss is recommended (where clinically appropriate). Please note that untreated obstructive sleep apnea may carry additional perioperative morbidity. Patients with significant obstructive sleep apnea should receive perioperative PAP therapy and the surgeons and particularly the anesthesiologist should be informed of the diagnosis and the severity of the sleep disordered breathing. The patient should be cautioned not to drive, work at heights, or operate dangerous or heavy equipment when tired or sleepy. Review and reiteration of good sleep hygiene measures should be pursued with any patient. Other causes of the patient's symptoms, including circadian rhythm disturbances, an underlying mood disorder, medication effect and/or an underlying medical problem cannot be ruled out based on this test. Clinical correlation is recommended.  The patient and her referring provider will be notified of the test results. The patient will be seen in follow up in sleep clinic at [facility].  I certify that I have reviewed the raw data recording prior to the issuance of this report in accordance with the standards of the American Academy of Sleep Medicine (AASM).  INTERPRETING PHYSICIAN:   Star Age, MD, PhD Medical Director, [place] [facility] [place], ABPN (Neurology and Sleep)   [facility] [address] [place], [place] [address] [phone]

## 2022-05-02 ENCOUNTER — Telehealth: Payer: Self-pay | Admitting: *Deleted

## 2022-05-02 NOTE — Telephone Encounter (Signed)
-----  Message from Star Age, MD sent at 04/27/2022  2:12 PM EST ----- Patient referred by PCP, seen by me on 01/31/2022, patient had a HST on 04/25/2022.    Please call and notify the patient that the recent home sleep test showed obstructive sleep apnea in the moderate range. I recommend treatment in the form of autoPAP, which means, that we don't have to bring her in for a sleep study with CPAP, but will let her start using a so called autoPAP machine at home, which is a CPAP-like machine with self-adjusting pressures. We will send the order to a local DME company (of her choice, or as per insurance requirement). The DME representative will fit her with a mask, educate her on how to use the machine, how to put the mask on, etc. I have placed an order in the chart. Please send the order, talk to patient, send report to referring MD. We will need a FU in sleep clinic for 10 weeks post-PAP set up, please arrange that with me or one of our NPs. Also reinforce the need for compliance with treatment. Thanks,   Star Age, MD, PhD [facility])

## 2022-05-02 NOTE — Telephone Encounter (Signed)
Called pt & LVM (ok per DPR) asking for call back to go over sleep study results and next steps. Advised SS showed moderate OSA and Dr Rexene Alberts recommends treatment with autopap. Left office number for call back to discuss the next steps. Also sent pt a mychart message.

## 2022-05-08 ENCOUNTER — Telehealth: Payer: Self-pay | Admitting: Neurology

## 2022-05-08 NOTE — Telephone Encounter (Signed)
Called patient to discuss sleep study results. No answer at this time. LVM for the patient to call back.   

## 2022-05-08 NOTE — Telephone Encounter (Signed)
-----  Message from Darleen Crocker, RN sent at 05/08/2022  8:40 AM EST -----  ----- Message ----- From: Star Age, MD Sent: 04/27/2022   2:12 PM EST To: Gna-Pod 4 Results  Patient referred by PCP, seen by me on 01/31/2022, patient had a HST on 04/25/2022.    Please call and notify the patient that the recent home sleep test showed obstructive sleep apnea in the moderate range. I recommend treatment in the form of autoPAP, which means, that we don't have to bring her in for a sleep study with CPAP, but will let her start using a so called autoPAP machine at home, which is a CPAP-like machine with self-adjusting pressures. We will send the order to a local DME company (of her choice, or as per insurance requirement). The DME representative will fit her with a mask, educate her on how to use the machine, how to put the mask on, etc. I have placed an order in the chart. Please send the order, talk to patient, send report to referring MD. We will need a FU in sleep clinic for 10 weeks post-PAP set up, please arrange that with me or one of our NPs. Also reinforce the need for compliance with treatment. Thanks,   Star Age, MD, PhD [facility])

## 2022-05-10 ENCOUNTER — Encounter: Payer: Self-pay | Admitting: Neurology

## 2022-05-22 ENCOUNTER — Ambulatory Visit: Payer: Commercial Managed Care - HMO | Admitting: Obstetrics and Gynecology

## 2022-05-22 ENCOUNTER — Encounter: Payer: Self-pay | Admitting: Obstetrics and Gynecology

## 2022-05-22 VITALS — BP 118/84

## 2022-05-22 DIAGNOSIS — Z113 Encounter for screening for infections with a predominantly sexual mode of transmission: Secondary | ICD-10-CM | POA: Diagnosis not present

## 2022-05-22 DIAGNOSIS — N94819 Vulvodynia, unspecified: Secondary | ICD-10-CM

## 2022-05-22 DIAGNOSIS — N941 Unspecified dyspareunia: Secondary | ICD-10-CM | POA: Diagnosis not present

## 2022-05-22 DIAGNOSIS — N898 Other specified noninflammatory disorders of vagina: Secondary | ICD-10-CM | POA: Diagnosis not present

## 2022-05-22 LAB — WET PREP FOR TRICH, YEAST, CLUE

## 2022-05-22 MED ORDER — LIDOCAINE 5 % EX OINT: TOPICAL_OINTMENT | CUTANEOUS | 0 refills | Status: AC

## 2022-05-22 NOTE — Progress Notes (Signed)
GYNECOLOGY  VISIT   HPI: 32 y.o.   Single White or Caucasian Not Hispanic or Latino  female   [id number] with Patient's last menstrual period was 04/29/2022.   here for pain with penetration during intercourse.  Recently sexually active within the last year. She has never been able to complete penile penetration because it is too painful (she has tried with more than one prior partner). She has a current partner and wants to be active. Some discomfort with inserting 1 finger. She doesn't have issues with lubrication. Able to orgasm from clitoral stimulation. The clitoris is sensitive but not painful.  No baseline vaginal irritation, no increase in d/c.    GYNECOLOGIC HISTORY: LMP: 04/29/22 Contraception: condoms Menopausal hormone therapy: n/a.        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   Migraine 10/08/2013    Past Medical History:  Diagnosis Date   Abnormal uterine bleeding (AUB)    Hyperlipidemia    Migraine without aura    PCOS (polycystic ovarian syndrome)    Seasonal allergies    Wears glasses     Past Surgical History:  Procedure Laterality Date   HYSTEROSCOPY WITH D & C N/A 06/21/2020   Procedure: DILATATION AND CURETTAGE /HYSTEROSCOPY;  Surgeon: Salvadore Dom, MD;  Location: [place];  Service: Gynecology;  Laterality: N/A;   REDUCTION MAMMAPLASTY Bilateral 2007   WISDOM TOOTH EXTRACTION  teen    Current Outpatient Medications  Medication Sig Dispense Refill   lidocaine (XYLOCAINE) 5 % ointment Apply a pea sized amount topically 20 minutes prior to intercourse, gently wipe off just prior to  intercourse. 30 g 0   acetaminophen (TYLENOL) 500 MG tablet Take 1,000 mg by mouth as needed.     cetirizine (ZYRTEC) 10 MG tablet Take 10 mg by mouth as needed for allergies.     ibuprofen (ADVIL) 800 MG tablet Take 800 mg by mouth as needed for headache.     naproxen sodium (ALEVE) 220 MG tablet Take 220 mg by mouth as needed.     NIKKI 3-0.02 MG tablet TAKE 1 TABLET BY MOUTH EVERY DAY 28 tablet 0   No current facility-administered medications for this visit.     ALLERGIES: Latex and Lactose intolerance (gi)  Family History  Problem Relation Age of Onset   Sleep apnea Mother    Ovarian cancer Paternal Grandmother     Social History   Socioeconomic History   Marital status: Single    Spouse name: Not on file   Number of children: Not on file   Years of education: Not on file   Highest education level: Bachelor's degree (e.g., BA, AB, BS)  Occupational History   Not on file  Tobacco Use   Smoking status: Never   Smokeless tobacco: Never  Vaping Use   Vaping Use: Never used  Substance and Sexual Activity  Alcohol use: Yes    Comment: SOCIAL   Drug use: Never   Sexual activity: Yes    Partners: Male    Birth control/protection: Condom  Other Topics Concern   Not on file  Social History Narrative   Not on file   Social Determinants of Health   Financial Resource Strain: Low Risk  (06/21/2021)   Overall Financial Resource Strain (CARDIA)    Difficulty of Paying Living Expenses: Not very hard  Food Insecurity: No Food Insecurity (06/21/2021)   Hunger Vital Sign    Worried About Running Out of Food in the Last Year: Never true    Ran Out of Food in the Last Year: Never true  Transportation Needs: No Transportation Needs (06/21/2021)   PRAPARE - Hydrologist (Medical): No    Lack of Transportation (Non-Medical): No  Physical Activity: Sufficiently Active (06/21/2021)   Exercise Vital Sign    Days of Exercise per Week: 5 days    Minutes of  Exercise per Session: 30 min  Stress: Stress Concern Present (06/21/2021)   [place]    Feeling of Stress : Rather much  Social Connections: Socially Isolated (06/21/2021)   Social Connection and Isolation Panel [NHANES]    Frequency of Communication with Friends and Family: More than three times a week    Frequency of Social Gatherings with Friends and Family: Three times a week    Attends Religious Services: Never    Active Member of Clubs or Organizations: No    Attends Music therapist: Not on file    Marital Status: Never married  Intimate Partner Violence: Not on file    Review of Systems  All other systems reviewed and are negative.   PHYSICAL EXAMINATION:    BP 118/84 (BP Location: Left Arm, Patient Position: Sitting, Cuff Size: Large)   LMP 04/29/2022     General appearance: alert, cooperative and appears stated age   Pelvic: External genitalia:  no lesions, she is tender in 3 out of 4 quadrants of the vestibule when gently palpated with a lubricated cotton swab.               Urethra:  normal appearing urethra with no masses, tenderness or lesions              Bartholins and Skenes: normal                 Vagina: normal appearing vagina with an increase in thick, white, clumpy vaginal discharge. Able to insert a pediatric speculum and one finger vaginally, unable to insert 2 fingers (painful).              Cervix: no cervical motion tenderness and no lesions              Bimanual Exam:  Uterus:  normal size, contour, position, consistency, mobility, non-tender              Adnexa: no mass, fullness, tenderness              Bladder: not tender  Pelvic floor: not tender  Chaperone was present for exam  1. Dyspareunia in female Negative wet prep. Tender  - lidocaine (XYLOCAINE) 5 % ointment; Apply a pea sized amount topically 20 minutes prior to intercourse, gently wipe off just prior to  intercourse.  Dispense: 30 g; Refill: 0 - SureSwab Advanced Vaginitis Plus,TMA - Ambulatory referral to Physical Therapy  2. Vulvodynia -lidocaine ointment - Ambulatory referral to Physical Therapy  3. Vaginal discharge - WET PREP FOR [place], YEAST, CLUE - SureSwab Advanced Vaginitis Plus,TMA  4. Screening examination for STD (sexually transmitted disease) - SureSwab Advanced Vaginitis Plus,TMA

## 2022-05-23 LAB — SURESWAB® ADVANCED VAGINITIS PLUS,TMA
C. trachomatis RNA, TMA: NOT DETECTED
CANDIDA SPECIES: NOT DETECTED
Candida glabrata: NOT DETECTED
N. gonorrhoeae RNA, TMA: NOT DETECTED
SURESWAB(R) ADV BACTERIAL VAGINOSIS(BV),TMA: NEGATIVE
TRICHOMONAS VAGINALIS (TV),TMA: NOT DETECTED

## 2022-07-26 IMAGING — US US RENAL
1 series · 14 of 25 positions shown · non-contrast
Comparison: None.

CLINICAL DATA: Bicornuate uterus

EXAM:
RENAL / URINARY TRACT ULTRASOUND COMPLETE

[Series 1: us renal · 0.25mm/px · 14 of 38 slices shown]
[im 1/38]
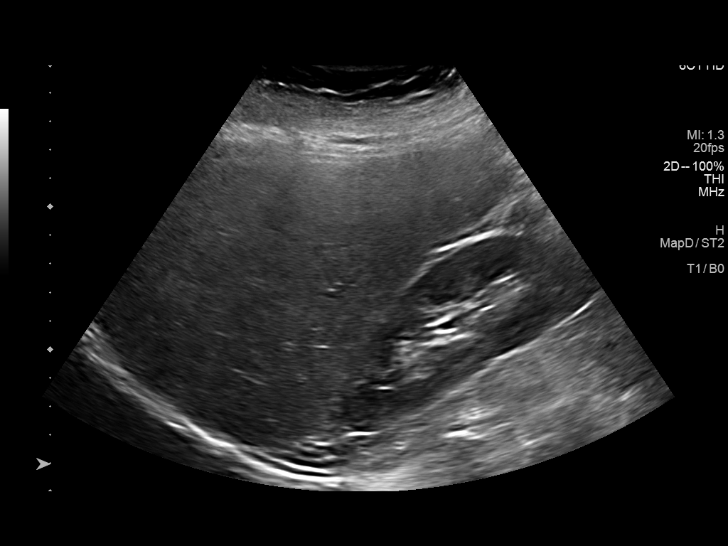
[im 4/38]
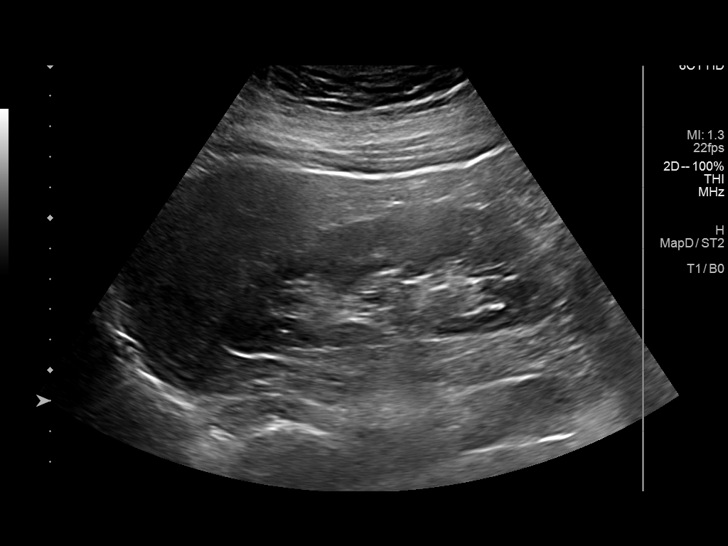
[im 7/38]
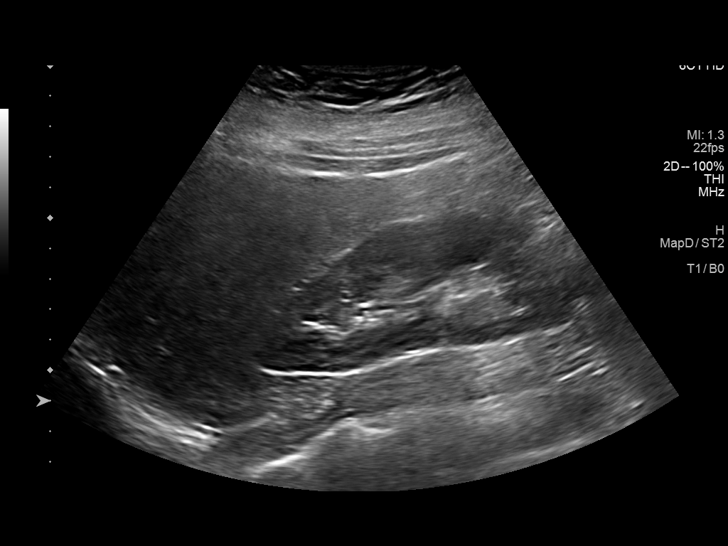
[im 10/38]
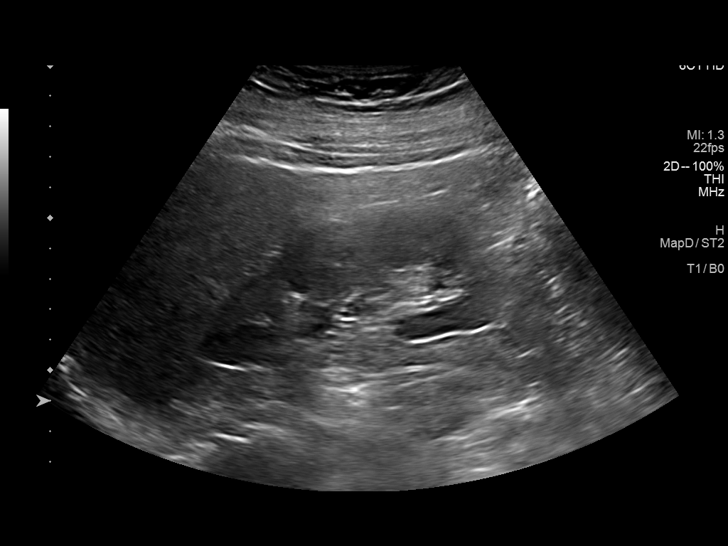
[im 13/38]
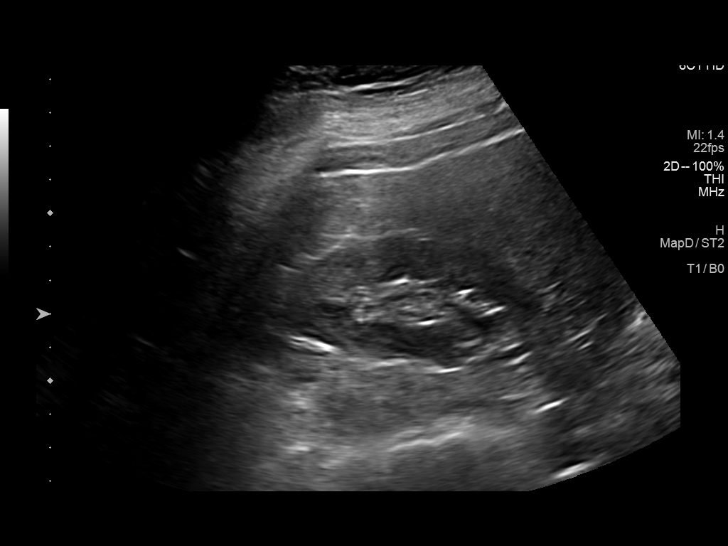
[im 14/38]
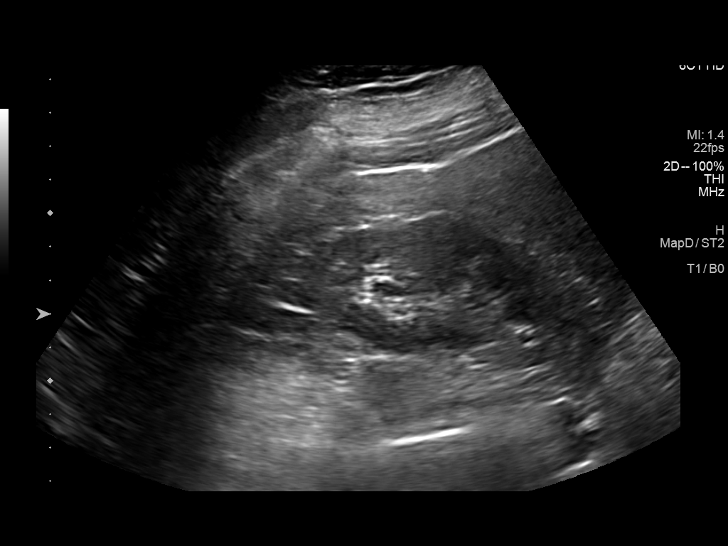
[im 17/38]
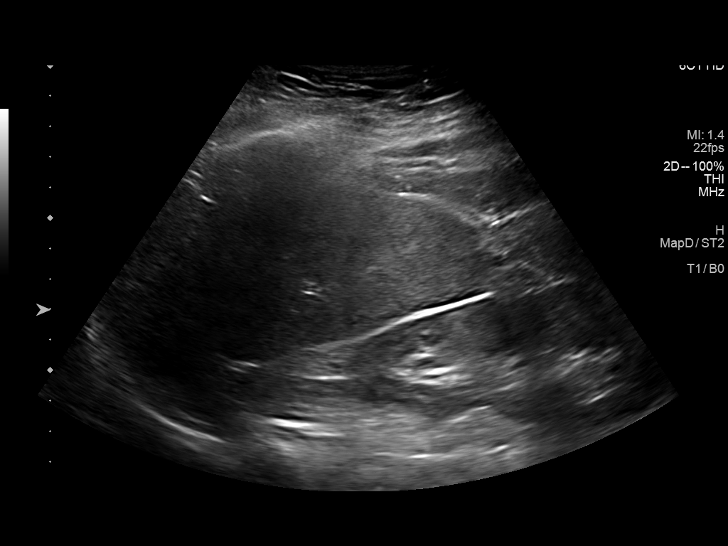
[im 21/38]
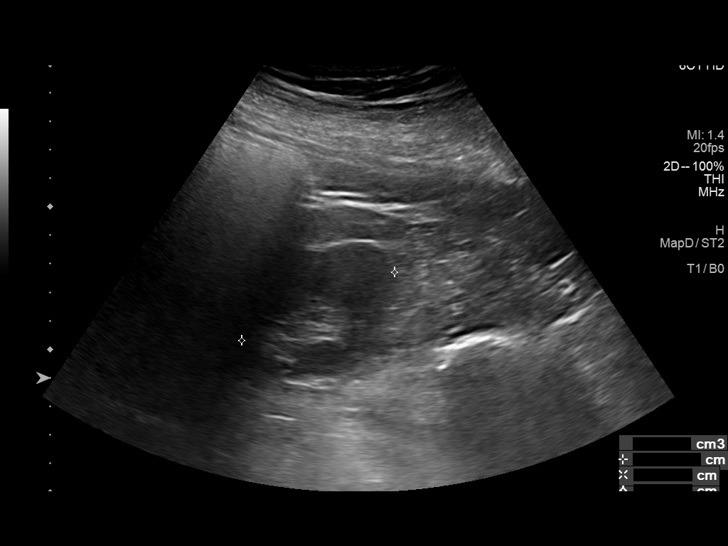
[im 24/38]
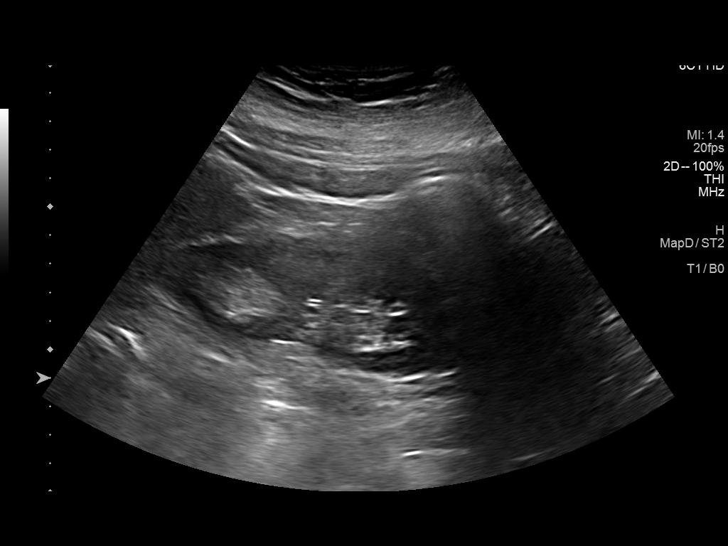
[im 25/38]
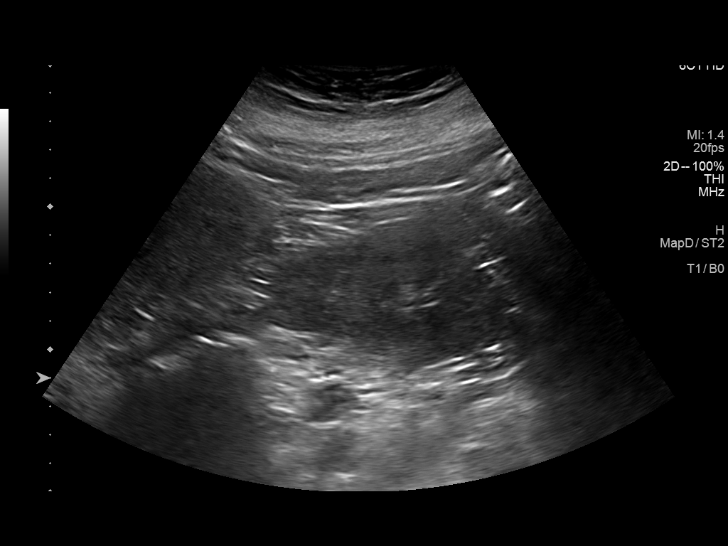
[im 28/38]
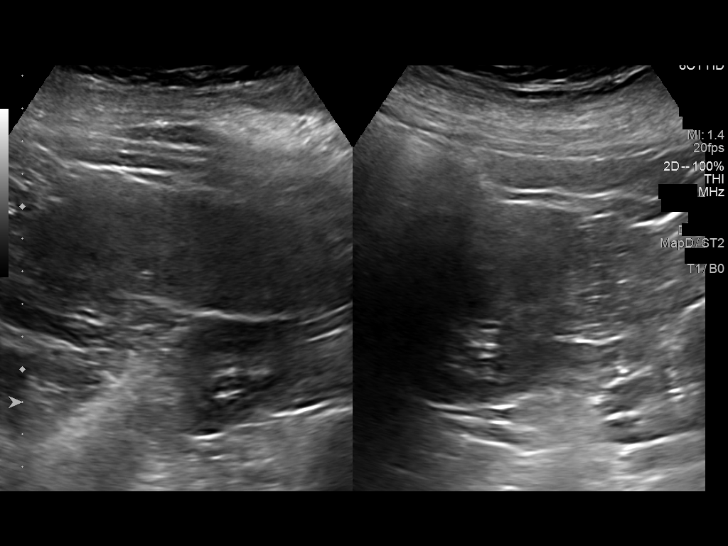
[im 31/38]
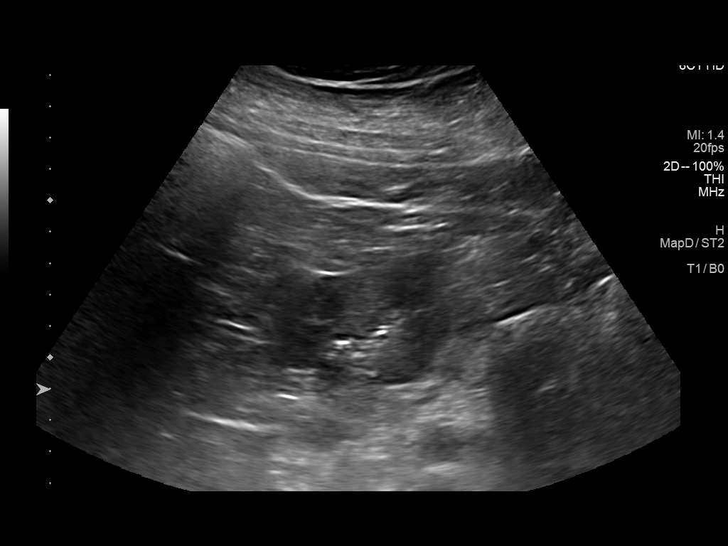
[im 34/38]
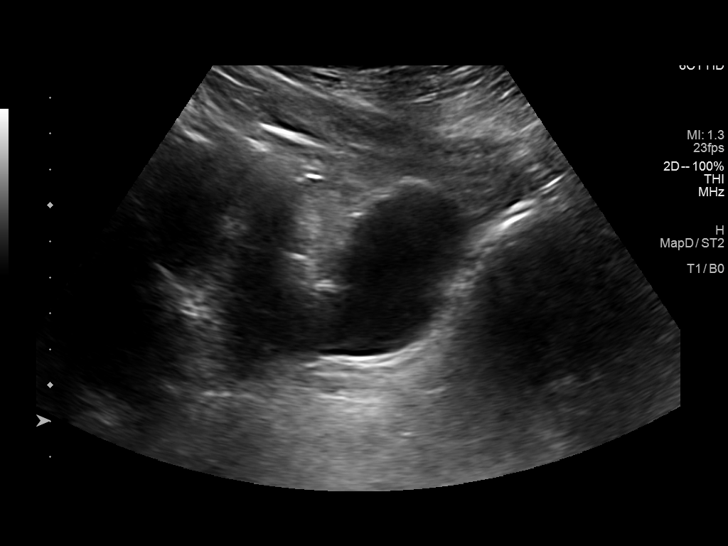
[im 38/38]
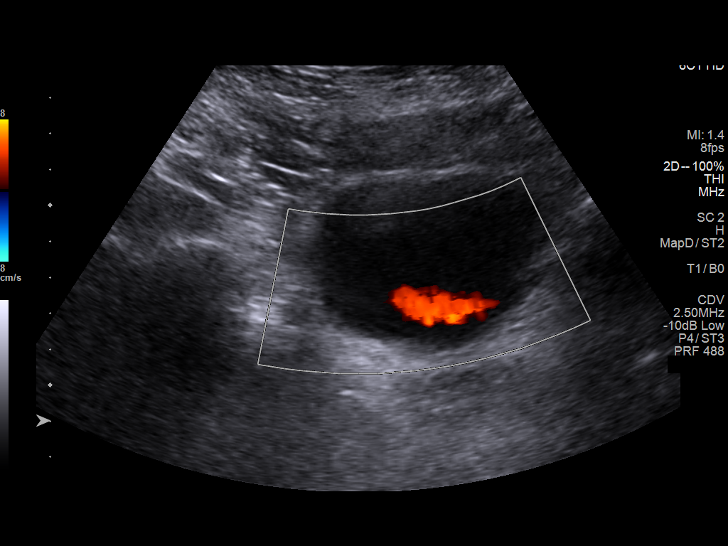

[14 of 25 positions shown; findings below may reference images not displayed]

FINDINGS: Right Kidney:

Renal measurements: 12.4 x 4.3 x 5.5 cm = volume: 155 mL.
Echogenicity within normal limits. No mass or hydronephrosis
visualized.

Left Kidney:

Renal measurements: 11.6 x 4.7 x 5.9 cm = volume: 168 mL.
Echogenicity within normal limits. No mass or hydronephrosis
visualized.

Bladder:

Appears normal for degree of bladder distention.

Other:

None.
IMPRESSION: Negative examination

## 2022-10-25 ENCOUNTER — Encounter: Payer: Self-pay | Admitting: Internal Medicine

## 2022-10-25 ENCOUNTER — Ambulatory Visit: Payer: Commercial Managed Care - HMO | Admitting: Internal Medicine

## 2022-10-25 VITALS — BP 120/84 | HR 103 | Temp 98.3°F | Wt 212.9 lb

## 2022-10-25 DIAGNOSIS — K5900 Constipation, unspecified: Secondary | ICD-10-CM

## 2022-10-25 DIAGNOSIS — K9049 Malabsorption due to intolerance, not elsewhere classified: Secondary | ICD-10-CM

## 2022-10-25 DIAGNOSIS — R140 Abdominal distension (gaseous): Secondary | ICD-10-CM

## 2022-10-25 NOTE — Progress Notes (Signed)
Established Patient Office Visit     CC/Reason for Visit: Food intolerances, abdominal bloating constipation, facial rash  HPI: Krystal Leonard is a 32 y.o. female who is coming in today for the above mentioned reasons. Past Medical History is significant for: Obstructive sleep apnea.  For the past 6 months or more she has had episodes of constipation, abdominal bloating, joint pain and a facial rash that appears to be cyclical.  They happen about every 3 to 6 weeks and will last 2 to 3 days.  She started seeing a nutritionist and had a food intolerance panel and was positive for many different intolerances including almonds, peach, peanuts, cantaloupe coriander garbanzo, garlic lecithin, lettuce, onion, paprika, tuna, wheat, yogurt amongst other dyes including red #3 and yellow #6   Past Medical/Surgical History: Past Medical History:  Diagnosis Date   Abnormal uterine bleeding (AUB)    Hyperlipidemia    Migraine without aura    PCOS (polycystic ovarian syndrome)    Seasonal allergies    Wears glasses     Past Surgical History:  Procedure Laterality Date   HYSTEROSCOPY WITH D & C N/A 06/21/2020   Procedure: DILATATION AND CURETTAGE /HYSTEROSCOPY;  Surgeon: [name], MD;  Location: [facility];  Service: Gynecology;  Laterality: N/A;   REDUCTION MAMMAPLASTY Bilateral 2007   WISDOM TOOTH EXTRACTION  teen    Social History:  reports that she has never smoked. She has never used smokeless tobacco. She reports current alcohol use. She reports that she does not use drugs.  Allergies: Allergies  Allergen Reactions   Latex Itching    Per pt the area with redness and numbness of hands when wearing latex gloves   Lactose Intolerance (Gi)     Family History:  Family History  Problem Relation Age of Onset   Sleep apnea Mother    Ovarian cancer Paternal Grandmother      Current Outpatient Medications:    acetaminophen (TYLENOL) 500 MG  tablet, Take 1,000 mg by mouth as needed., Disp: , Rfl:    cetirizine (ZYRTEC) 10 MG tablet, Take 10 mg by mouth as needed for allergies., Disp: , Rfl:    ibuprofen (ADVIL) 800 MG tablet, Take 800 mg by mouth as needed for headache., Disp: , Rfl:    lidocaine (XYLOCAINE) 5 % ointment, Apply a pea sized amount topically 20 minutes prior to intercourse, gently wipe off just prior to intercourse., Disp: 30 g, Rfl: 0   naproxen sodium (ALEVE) 220 MG tablet, Take 220 mg by mouth as needed., Disp: , Rfl:    NIKKI 3-0.02 MG tablet, TAKE 1 TABLET BY MOUTH EVERY DAY, Disp: 28 tablet, Rfl: 0  Review of Systems:  Negative unless indicated in HPI.   Physical Exam: Vitals:   10/25/22 0730  BP: 120/84  Pulse: (!) 103  Temp: 98.3 F (36.8 C)  TempSrc: Oral  SpO2: 99%  Weight: 212 lb 14.4 oz (96.6 kg)    Body mass index is 38.94 kg/m.   Physical Exam Vitals reviewed.  Constitutional:      Appearance: Normal appearance.  HENT:     Head: Normocephalic and atraumatic.  Eyes:     Conjunctiva/sclera: Conjunctivae normal.     Pupils: Pupils are equal, round, and reactive to light.  Skin:    General: Skin is warm and dry.  Neurological:     General: No focal deficit present.     Mental Status: She is alert and oriented to  person, place, and time.  Psychiatric:        Mood and Affect: Mood normal.        Behavior: Behavior normal.        Thought Content: Thought content normal.        Judgment: Judgment normal.      Impression and Plan:  Bloating -     Ambulatory referral to Gastroenterology -     Ambulatory referral to Allergy  Constipation, unspecified constipation type -     Ambulatory referral to Gastroenterology -     Ambulatory referral to Allergy  Food intolerance -     Ambulatory referral to Allergy   -Due to history of food intolerances and episodic symptoms I think it is reasonable to refer to allergy for full panel. -Refer to GI for consideration of colonoscopy due  to her bloating and constipation.  Time spent:31 minutes reviewing chart, interviewing and examining patient and formulating plan of care.     [name], MD [facility]

## 2022-11-27 ENCOUNTER — Ambulatory Visit: Payer: Commercial Managed Care - HMO | Admitting: Adult Health

## 2022-11-27 ENCOUNTER — Other Ambulatory Visit (HOSPITAL_COMMUNITY)
Admission: RE | Admit: 2022-11-27 | Discharge: 2022-11-27 | Disposition: A | Discharge status: Home / Self Care | Payer: Commercial Managed Care - HMO | Source: Ambulatory Visit | Attending: Adult Health | Admitting: Adult Health

## 2022-11-27 ENCOUNTER — Encounter: Payer: Self-pay | Admitting: Adult Health

## 2022-11-27 VITALS — BP 110/80 | HR 84 | Temp 98.3°F | Ht 62.0 in | Wt 216.0 lb

## 2022-11-27 DIAGNOSIS — Z113 Encounter for screening for infections with a predominantly sexual mode of transmission: Secondary | ICD-10-CM | POA: Diagnosis present

## 2022-11-27 NOTE — Progress Notes (Signed)
Subjective:    Patient ID: Krystal Leonard, female    DOB: [birth year], 32 y.o.   MRN: [id number]  Exposure to STD    32 year old female who  has a past medical history of Abnormal uterine bleeding (AUB), Hyperlipidemia, Migraine without aura, PCOS (polycystic ovarian syndrome), Seasonal allergies, and Wears glasses.  She is a patient of Dr. Ardyth Harps who I am seeing today for STD Screening. She denies symptoms of STD's, just wants to make sure.    Review of Systems See HPI   Past Medical History:  Diagnosis Date   Abnormal uterine bleeding (AUB)    Hyperlipidemia    Migraine without aura    PCOS (polycystic ovarian syndrome)    Seasonal allergies    Wears glasses     Social History   Socioeconomic History   Marital status: Single    Spouse name: Not on file   Number of children: Not on file   Years of education: Not on file   Highest education level: Bachelor's degree (e.g., BA, AB, BS)  Occupational History   Not on file  Tobacco Use   Smoking status: Never   Smokeless tobacco: Never  Vaping Use   Vaping status: Never Used  Substance and Sexual Activity   Alcohol use: Yes    Comment: SOCIAL   Drug use: Never   Sexual activity: Yes    Partners: Male    Birth control/protection: Condom  Other Topics Concern   Not on file  Social History Narrative   Not on file   Social Determinants of Health   Financial Resource Strain: Low Risk  (06/21/2021)   Overall Financial Resource Strain (CARDIA)    Difficulty of Paying Living Expenses: Not very hard  Food Insecurity: No Food Insecurity (06/21/2021)   Hunger Vital Sign    Worried About Running Out of Food in the Last Year: Never true    Ran Out of Food in the Last Year: Never true  Transportation Needs: No Transportation Needs (06/21/2021)   PRAPARE - Administrator, Civil Service (Medical): No    Lack of Transportation (Non-Medical): No  Physical Activity: Sufficiently Active (06/21/2021)    Exercise Vital Sign    Days of Exercise per Week: 5 days    Minutes of Exercise per Session: 30 min  Stress: Stress Concern Present (06/21/2021)   [employer] of Occupational Health - Occupational Stress Questionnaire    Feeling of Stress : Rather much  Social Connections: Socially Isolated (06/21/2021)   Social Connection and Isolation Panel [NHANES]    Frequency of Communication with Friends and Family: More than three times a week    Frequency of Social Gatherings with Friends and Family: Three times a week    Attends Religious Services: Never    Active Member of Clubs or Organizations: No    Attends Engineer, structural: Not on file    Marital Status: Never married  Intimate Partner Violence: Not on file    Past Surgical History:  Procedure Laterality Date   HYSTEROSCOPY WITH D & C N/A 06/21/2020   Procedure: DILATATION AND CURETTAGE /HYSTEROSCOPY;  Surgeon: [name], MD;  Location: [facility] [place];  Service: Gynecology;  Laterality: N/A;   REDUCTION MAMMAPLASTY Bilateral 2007   WISDOM TOOTH EXTRACTION  teen    Family History  Problem Relation Age of Onset   Sleep apnea Mother    Ovarian cancer Paternal Grandmother     Allergies  Allergen  Reactions   Latex Itching    Per pt the area with redness and numbness of hands when wearing latex gloves   Lactose Intolerance (Gi)     Current Outpatient Medications on File Prior to Visit  Medication Sig Dispense Refill   acetaminophen (TYLENOL) 500 MG tablet Take 1,000 mg by mouth as needed.     cetirizine (ZYRTEC) 10 MG tablet Take 10 mg by mouth as needed for allergies.     ibuprofen (ADVIL) 800 MG tablet Take 800 mg by mouth as needed for headache.     lidocaine (XYLOCAINE) 5 % ointment Apply a pea sized amount topically 20 minutes prior to intercourse, gently wipe off just prior to intercourse. 30 g 0   naproxen sodium (ALEVE) 220 MG tablet Take 220 mg by mouth as needed.     NIKKI 3-0.02 MG  tablet TAKE 1 TABLET BY MOUTH EVERY DAY 28 tablet 0   No current facility-administered medications on file prior to visit.    BP 110/80   Pulse 84   Temp 98.3 F (36.8 C) (Oral)   Ht 5\' 2"  (1.575 m)   Wt 216 lb (98 kg)   LMP 10/27/2022 (Exact Date)   SpO2 99%   BMI 39.51 kg/m      Objective:   Physical Exam Vitals and nursing note reviewed.  Constitutional:      Appearance: Normal appearance.  Cardiovascular:     Rate and Rhythm: Normal rate and regular rhythm.     Pulses: Normal pulses.     Heart sounds: Normal heart sounds.  Pulmonary:     Effort: Pulmonary effort is normal.     Breath sounds: Normal breath sounds.  Musculoskeletal:        General: Normal range of motion.  Skin:    General: Skin is warm and dry.  Neurological:     General: No focal deficit present.     Mental Status: She is alert and oriented to person, place, and time.  Psychiatric:        Mood and Affect: Mood normal.        Behavior: Behavior normal.        Thought Content: Thought content normal.        Judgment: Judgment normal.       Assessment & Plan:  1. Screening for STD (sexually transmitted disease) - Cervicovaginal ancillary only - HepB+HepC+HIV Panel; Future - RPR; Future  Shirline Frees, NP

## 2022-12-04 ENCOUNTER — Ambulatory Visit: Payer: Commercial Managed Care - HMO | Admitting: Internal Medicine

## 2022-12-10 ENCOUNTER — Encounter: Payer: Self-pay | Admitting: Internal Medicine

## 2022-12-10 ENCOUNTER — Ambulatory Visit (INDEPENDENT_AMBULATORY_CARE_PROVIDER_SITE_OTHER): Payer: Commercial Managed Care - HMO | Admitting: Internal Medicine

## 2022-12-10 ENCOUNTER — Other Ambulatory Visit: Payer: Self-pay

## 2022-12-10 VITALS — BP 110/80 | HR 105 | Temp 99.4°F | Resp 14 | Ht 63.0 in | Wt 213.0 lb

## 2022-12-10 DIAGNOSIS — R140 Abdominal distension (gaseous): Secondary | ICD-10-CM | POA: Insufficient documentation

## 2022-12-10 DIAGNOSIS — T7819XA Other adverse food reactions, not elsewhere classified, initial encounter: Secondary | ICD-10-CM

## 2022-12-10 DIAGNOSIS — T781XXA Other adverse food reactions, not elsewhere classified, initial encounter: Secondary | ICD-10-CM

## 2022-12-10 DIAGNOSIS — J310 Chronic rhinitis: Secondary | ICD-10-CM

## 2022-12-10 DIAGNOSIS — E739 Lactose intolerance, unspecified: Secondary | ICD-10-CM

## 2022-12-10 DIAGNOSIS — L719 Rosacea, unspecified: Secondary | ICD-10-CM | POA: Insufficient documentation

## 2022-12-10 NOTE — Patient Instructions (Addendum)
Adverse food reaction:  - suspect food intolerance versus other non-food related gastrointestinal cause - food allergy testing today negative to adult allergy food panel - this rules out food ALLERGY (causes specific set of symptoms from histamine release-can be any combination of hives, nausea, vomiting, diarrhea, trouble breathing, swelling, that occurs quickly after eating a food, resolves within a day and occurs after every exposure to that food) - this does not rule out INTOLERANCE-certain foods can cause a variety of symptoms on a consistent basis, not necessarily histamine related symptoms as above (can be headaches, bloating, brain fog, etc), typically allergy testing is negative; avoidance is still recommended but no need to carry an epinephrine autoinjector - food journaling can be helpful to see if a specific food is causing symptoms; if unsure, remove from diet and if symptoms resolve, try adding back in.  If symptoms return, this food is likely causing symptoms and should be eaten in reduced quantities or avoided altogether. - agree with GI referral  Rosacea Treatment:  - preventative methods (see below) - would follow treatment advice of Dermatology  Rosacea Care: - avoid flushing which can worsen skin symptoms (extreme temperatures, sunlight, spicy foods, alcohol, exercise, stress) - cleanse skin gently with hypoallegenic cleanser with lukewarm water, wash with your fingers, and avoid harsh scrubbing - avoid irritating topical products such as toners, astringents, and chemical exfoliating agents (eg, alpha hydroxy acids); do not use rough cloths/brushes for exfoliation - daily application of a broad-spectrum sunscreen with a sun protection factor (SPF) of at least 30  -  green-tinted foundation can help to camouflage rash if you are bothered by the skin discoloration  Oral Allergy Syndrome: see chart below (honeydew is cross-reactive with ragweed and orchard grass).  - These  symptoms are typically not life-threatening and are because of a cross reaction between a pollen you are allergic to, and to a protein in specific foods (such as fresh fruits, vegetables, and nuts). - If you can eat these things and tolerate the symptoms, it is fine to continue to do so.  If not, you may avoid these fresh fruits and vegetables.   - Heating these foods, buying them canned, and peeling these foods should allow them to be consumed without symptoms or with less symptoms.  Chronic rhinitis: suspect allergic - if symptoms worsen or you would like to reduce your burden of yearly medications, return for environmental allergy testing -In the meantime, okay to continue taking oral over-the-counter second-generation antihistamines such as the ones listed below Your options include: Zyrtec (cetirizine) 10 mg, Claritin (loratadine) 10 mg, Xyzal (levocetirizine) 5 mg or Allegra (fexofenadine) 180 mg daily as needed  Lactose Intolerance- - this is not an allergy but rather a problem with breaking down dairy (due to an enzyme deficiency) which can lead to bloating, gas, diarrhea, nausea and vomiting - choose lactose free dairy or take lactaid prior to eating dairy products that contain lactose    Follow up : as needed It was a pleasure meeting you in clinic today! Thank you for allowing me to participate in your care.  Tonny Bollman, MD [facility]

## 2022-12-10 NOTE — Progress Notes (Signed)
NEW PATIENT Date of Service/Encounter:  12/10/22 Referring provider: Philip Leonard, Almira Bar* Primary care provider: Philip Leonard, Krystal Patricia, MD  Subjective:  Krystal Leonard is a 32 y.o. female with a PMHx of PCOS, obesity, migraines presenting today for evaluation of food intolerance History obtained from: chart review and patient.   She describes random episodes of bloating, abdominal pain due to constipation. She takes fibers and other medications, but these rarely seem to help during these severe episodes. In addition to the abdominal pain and bloating, her cheeks will get flared, the right one will turn purple.  They both feel warm to the touch. Her face will be tender when this occurs, to the point that is hard for her to wear glasses. She has seen a dermatologist and was diagnosed with rosacea, but held on treatment until today's appointment. Topical treatments and antibiotics were discussed but not started.  Can goes days or weeks without symptoms. She does meal prep and eats a lot of the same foods during a particular point in time.  No specific food triggers.  She did see a nutritionist and had food intolerance testing.  Was given a list of foods to avoid which she has been doing for some time, but is continue to have random episodes of symptoms. She is eating very bland at this time. The testing she had performed was MRT or mediator release testing from [facility] clinical laboratories.  Additionally she has had mouth itching and mild swelling after eating honeydew.  She does feel that she has environmental allergies particularly in the spring.  Will have congestion and sneezing, itchy mouth and nose.  Will take Zyrtec daily during that time which helps.  Additionally she does report lactose intolerance.  Anytime she eats or drinks dairy products will have immediate bloating and abdominal pain.  Chart Review:  Reviewed PCP notes from referral 10/25/2022:  Constipation, abdominal bloating, joint pain and facial rash which are cyclical.  Nutritionist obtained food intolerance panel positive for almonds, peach, peanuts, cantaloupe, Coriander, garbanzo, garlic, less than, lettuce, onion, paprika, tuna, wheat, yogurt, red dye #3 any yellow #6.  Referred to allergy for food intolerance and GI for consideration of colonoscopy due to bloating and constipation  Past Medical History: Past Medical History:  Diagnosis Date   Abnormal uterine bleeding (AUB)    Hyperlipidemia    Migraine without aura    PCOS (polycystic ovarian syndrome)    Seasonal allergies    Wears glasses    Medication List:  Current Outpatient Medications  Medication Sig Dispense Refill   acetaminophen (TYLENOL) 500 MG tablet Take 1,000 mg by mouth as needed.     cetirizine (ZYRTEC) 10 MG tablet Take 10 mg by mouth as needed for allergies.     ibuprofen (ADVIL) 800 MG tablet Take 800 mg by mouth as needed for headache.     lidocaine (XYLOCAINE) 5 % ointment Apply a pea sized amount topically 20 minutes prior to intercourse, gently wipe off just prior to intercourse. 30 g 0   naproxen sodium (ALEVE) 220 MG tablet Take 220 mg by mouth as needed.     NIKKI 3-0.02 MG tablet TAKE 1 TABLET BY MOUTH EVERY DAY (Patient not taking: Reported on 12/10/2022) 28 tablet 0   No current facility-administered medications for this visit.   Known Allergies:  Allergies  Allergen Reactions   Latex Itching    Per pt the area with redness and numbness of hands when wearing latex gloves   Lactose  Intolerance (Gi)    Past Surgical History: Past Surgical History:  Procedure Laterality Date   HYSTEROSCOPY WITH D & C N/A 06/21/2020   Procedure: DILATATION AND CURETTAGE /HYSTEROSCOPY;  Surgeon: [name], MD;  Location: [facility];  Service: Gynecology;  Laterality: N/A;   REDUCTION MAMMAPLASTY Bilateral 2007   WISDOM TOOTH EXTRACTION  teen   Family History: Family History   Problem Relation Age of Onset   Sleep apnea Mother    Ovarian cancer Paternal Grandmother    Social History: Krystal Leonard lives in a house built 98 years ago, + water damage, wood floors, electric heating, central AC, indoor dog, + cockroaches, using does not protection on the bedding but not pillows, no smoke exposure, she is a Museum/gallery conservator, no HEPA filter in the home, [place].   ROS:  All other systems negative except as noted per HPI.  Objective:  Blood pressure 110/80, pulse (!) 105, temperature 99.4 F (37.4 C), temperature source Temporal, resp. rate 14, height 5\' 3"  (1.6 m), weight 213 lb (96.6 kg), last menstrual period 10/27/2022, SpO2 99%. Body mass index is 37.73 kg/m. Physical Exam:  General Appearance:  Alert, cooperative, no distress, appears stated age  Head:  Normocephalic, without obvious abnormality, atraumatic  Eyes:  Conjunctiva clear, EOM's intact  Ears EACs normal bilaterally and normal TMs bilaterally  Nose: Nares normal, hypertrophic turbinates, normal mucosa, and no visible anterior polyps  Throat: Lips, tongue normal; teeth and gums normal, normal posterior oropharynx  Neck: Supple, symmetrical  Lungs:   clear to auscultation bilaterally, Respirations unlabored, no coughing  Heart:  regular rate and rhythm and no murmur, Appears well perfused  Extremities: No edema  Skin: Skin color, texture, turgor normal and no rashes or lesions on visualized portions of skin  Neurologic: No gross deficits   Diagnostics: Skin Testing: Food allergy panel. Adequate positive and negative controls. Results discussed with patient/family.  Food Adult Perc - 12/10/22 0900     Time Antigen Placed 1610    Allergen Manufacturer Waynette Buttery    Location Back    Number of allergen test 72     Control-buffer 50% Glycerol Negative    Control-Histamine 3+    1. Peanut Negative    2. Soybean Negative    3. Wheat Negative    4. Sesame  Negative    5. Milk, Cow Negative    6. Casein Negative    7. Egg White, Chicken Negative    8. Shellfish Mix Negative    9. Fish Mix Negative    10. Cashew Negative    11. Walnut Food Negative    12. Almond Negative    13. Hazelnut Negative    14. Pecan Food Negative    15. Pistachio Negative    16. [place] Nut Negative    17. Coconut Negative    18. Trout Negative    19. Tuna Negative    20. Salmon Negative    21. Flounder Negative    22. Codfish Negative    23. Shrimp Negative    24. Crab Negative    25. Lobster Negative    26. Oyster Negative    27. Scallops Negative    28. Oat  Negative    29. Rice Negative    30. Barley Negative    31. Rye  Negative    32. Hops Negative    33. [place] Meat Negative    34. Chicken Meat Negative  35. Pork Negative    36. Beef Negative    37. Lamb Negative    38. Tomato Negative    39. White Potato Negative    40. Sweet Potato Negative    41. Pea, Green/English Negative    42. Navy Bean Negative    43. Green Beans Negative    44. Squash Negative    45. Green Pepper Negative    46. Mushrooms Negative    47. Onion Negative    48. Avocado Negative    49. Cabbage Negative    50. Carrots Negative    51. Celery Negative    52. Corn Negative    53. Cucumber Negative    54. Grape (White seedless) Negative    55. Orange  Negative    56. Lemon Negative    57. Banana Negative    58. Apple Negative    59. Peach Negative    60. Strawberry Negative    61. Blueberry Negative    62. Cherry Negative    63. Cantaloupe Negative    64. Watermelon Negative    65. Pineapple Negative    66. Chocolate/Cacao Bean Negative    67. Cinnamon Negative    68. Nutmeg Negative    69. Ginger Negative    70. Garlic Negative    71. Pepper, Black Negative    72. Mustard Negative             Allergy testing results were read and interpreted by myself, documented by clinical staff.  Labs:  Lab Orders  No laboratory test(s) ordered today    Assessment and Plan  Adverse food reaction:  - suspect food intolerance versus other non-food related gastrointestinal cause - food allergy testing today negative to adult allergy food panel - this rules out food ALLERGY (causes specific set of symptoms from histamine release-can be any combination of hives, nausea, vomiting, diarrhea, trouble breathing, swelling, that occurs quickly after eating a food, resolves within a day and occurs after every exposure to that food) - this does not rule out INTOLERANCE-certain foods can cause a variety of symptoms on a consistent basis, not necessarily histamine related symptoms as above (can be headaches, bloating, brain fog, etc), typically allergy testing is negative; avoidance is still recommended but no need to carry an epinephrine autoinjector - food journaling can be helpful to see if a specific food is causing symptoms; if unsure, remove from diet and if symptoms resolve, try adding back in.  If symptoms return, this food is likely causing symptoms and should be eaten in reduced quantities or avoided altogether. - agree with GI referral  Rosacea Treatment:  - preventative methods (see below) - would follow treatment advice of Dermatology  Rosacea Care: - avoid flushing which can worsen skin symptoms (extreme temperatures, sunlight, spicy foods, alcohol, exercise, stress) - cleanse skin gently with hypoallegenic cleanser with lukewarm water, wash with your fingers, and avoid harsh scrubbing - avoid irritating topical products such as toners, astringents, and chemical exfoliating agents (eg, alpha hydroxy acids); do not use rough cloths/brushes for exfoliation - daily application of a broad-spectrum sunscreen with a sun protection factor (SPF) of at least 30  -  green-tinted foundation can help to camouflage rash if you are bothered by the skin discoloration  Oral Allergy Syndrome: see chart below (honeydew is cross-reactive with ragweed and  orchard grass).  - These symptoms are typically not life-threatening and are because of a cross reaction between a pollen you are allergic to,  and to a protein in specific foods (such as fresh fruits, vegetables, and nuts). - If you can eat these things and tolerate the symptoms, it is fine to continue to do so.  If not, you may avoid these fresh fruits and vegetables.   - Heating these foods, buying them canned, and peeling these foods should allow them to be consumed without symptoms or with less symptoms.  Chronic rhinitis: suspect allergic - if symptoms worsen or you would like to reduce your burden of yearly medications, return for environmental allergy testing -In the meantime, okay to continue taking oral over-the-counter second-generation antihistamines such as the ones listed below Your options include: Zyrtec (cetirizine) 10 mg, Claritin (loratadine) 10 mg, Xyzal (levocetirizine) 5 mg or Allegra (fexofenadine) 180 mg daily as needed  Lactose Intolerance- - this is not an allergy but rather a problem with breaking down dairy (due to an enzyme deficiency) which can lead to bloating, gas, diarrhea, nausea and vomiting - choose lactose free dairy or take lactaid prior to eating dairy products that contain lactose    Follow up : as needed It was a pleasure meeting you in clinic today! Thank you for allowing me to participate in your care.  This note in its entirety was forwarded to the Provider who requested this consultation.  Other: None  Thank you for your kind referral. I appreciate the opportunity to take part in [place] care. Please do not hesitate to contact me with questions.  Sincerely,  Tonny Bollman, MD [facility]

## 2023-02-05 ENCOUNTER — Other Ambulatory Visit: Payer: Commercial Managed Care - HMO

## 2023-02-05 ENCOUNTER — Encounter: Payer: Self-pay | Admitting: Gastroenterology

## 2023-02-05 ENCOUNTER — Ambulatory Visit (INDEPENDENT_AMBULATORY_CARE_PROVIDER_SITE_OTHER): Payer: Commercial Managed Care - HMO | Admitting: Gastroenterology

## 2023-02-05 VITALS — BP 134/88 | HR 100 | Ht 62.0 in | Wt 218.0 lb

## 2023-02-05 DIAGNOSIS — R140 Abdominal distension (gaseous): Secondary | ICD-10-CM | POA: Diagnosis not present

## 2023-02-05 DIAGNOSIS — K5900 Constipation, unspecified: Secondary | ICD-10-CM

## 2023-02-05 MED ORDER — SENNOSIDES-DOCUSATE SODIUM 8.6-50 MG PO TABS: 1.0000 | ORAL_TABLET | Freq: Every day | ORAL | 1 refills | Status: AC

## 2023-02-05 NOTE — Patient Instructions (Addendum)
Your provider has requested that you go to the basement level for lab work before leaving today. Press "B" on the elevator. The lab is located at the first door on the left as you exit the elevator.  For constipation we are recommend taking senna 2 tabs a night as needed.  _______________________________________________________  If your blood pressure at your visit was 140/90 or greater, please contact your primary care physician to follow up on this.  _______________________________________________________  If you are age 42 or older, your body mass index should be between 23-30. Your Body mass index is 39.87 kg/m. If this is out of the aforementioned range listed, please consider follow up with your Primary Care Provider.  If you are age 86 or younger, your body mass index should be between 19-25. Your Body mass index is 39.87 kg/m. If this is out of the aformentioned range listed, please consider follow up with your Primary Care Provider.   ________________________________________________________  The [place] [facility] providers would like to encourage you to use [facility] to communicate with providers for non-urgent requests or questions.  Due to long hold times on the telephone, sending your provider a message by [facility] may be a faster and more efficient way to get a response.  Please allow 48 business hours for a response.  Please remember that this is for non-urgent requests.  _______________________________________________________ It was a pleasure to see you today!  Thank you for trusting me with your gastrointestinal care!

## 2023-02-05 NOTE — Progress Notes (Unsigned)
HPI : Krystal Leonard is a 32 y.o. female who is referred to [place] by Philip Aspen, [place]*for further evaluation of episodic constipation, abdominal pain and skin flushing.  She states that she has been having these episodes for at least the past year, maybe longer.  Historically, these episodes would occur maybe once a month and would last for 1 or 2 days.  However, she had a more severe episode about 3 months ago.  This episode lasted 7 days and was associated with severe abdominal pain, and 7 days without a bowel movement.  During this time, she also had significant facial flushing, reporting that her skin on her face was purple in color and very tender and sensitive.  She also reports having diffuse joint pains and insomnia during this time. The symptoms resolved spontaneously, and she has been doing okay since then. She has regular bowel movements, typically once a day and denies any significant abdominal pain.  No problems with diarrhea unless she eats dairy.  No blood in the stool.  She reports that her weight has fluctuated, but denies any significant sustained weight loss.  Bloating is an occasional issue for her. She was seen by an allergist, as she thought that the symptoms may be allergic reactions of some foods that she was eating.  Extensive food allergy testing was negative.  Patient denies any family history of celiac disease or inflammatory bowel disease.  She reports that her mother has Graves' disease and another autoimmune disorder that she cannot recall the name of.    Past Medical History:  Diagnosis Date   Abnormal uterine bleeding (AUB)    Hyperlipidemia    Migraine without aura    PCOS (polycystic ovarian syndrome)    Seasonal allergies    Wears glasses      Past Surgical History:  Procedure Laterality Date   HYSTEROSCOPY WITH D & C N/A 06/21/2020   Procedure: DILATATION AND CURETTAGE /HYSTEROSCOPY;  Surgeon: [name], MD;  Location: [facility]  [place];  Service: Gynecology;  Laterality: N/A;   REDUCTION MAMMAPLASTY Bilateral 2007   WISDOM TOOTH EXTRACTION  teen   Family History  Problem Relation Age of Onset   Sleep apnea Mother    Deep vein thrombosis Mother    Colon polyps Mother    Colon polyps Father    Dementia Father    Ovarian cancer Paternal Grandmother    Social History   Tobacco Use   Smoking status: Never   Smokeless tobacco: Never  Vaping Use   Vaping status: Never Used  Substance Use Topics   Alcohol use: Yes    Comment: SOCIAL   Drug use: Never   Current Outpatient Medications  Medication Sig Dispense Refill   acetaminophen (TYLENOL) 500 MG tablet Take 1,000 mg by mouth as needed.     cetirizine (ZYRTEC) 10 MG tablet Take 10 mg by mouth as needed for allergies.     ibuprofen (ADVIL) 800 MG tablet Take 800 mg by mouth as needed for headache.     lidocaine (XYLOCAINE) 5 % ointment Apply a pea sized amount topically 20 minutes prior to intercourse, gently wipe off just prior to intercourse. 30 g 0   naproxen sodium (ALEVE) 220 MG tablet Take 220 mg by mouth as needed.     No current facility-administered medications for this visit.   Allergies  Allergen Reactions   Latex Itching    Per pt the area with redness and numbness of hands when  wearing latex gloves   Lactose Intolerance (Gi)      Review of Systems: All systems reviewed and negative except where noted in HPI.    No results found.  Physical Exam: BP 134/88   Pulse 100   Ht 5\' 2"  (1.575 m)   Wt 218 lb (98.9 kg)   LMP 01/02/2023 (Exact Date)   BMI 39.87 kg/m  Constitutional: Pleasant,well-developed, Caucasian female in no acute distress. HEENT: Normocephalic and atraumatic. Conjunctivae are normal. No scleral icterus. Neck supple.  Cardiovascular: Normal rate, regular rhythm.  Pulmonary/chest: Effort normal and breath sounds normal. No wheezing, rales or rhonchi. Abdominal: Soft, nondistended, nontender. Bowel sounds  active throughout. There are no masses palpable. No hepatomegaly. Extremities: no edema Neurological: Alert and oriented to person place and time. Skin: Skin is warm and dry.  Facial erythema consistent with rosacea Psychiatric: Normal mood and affect. Behavior is normal.  CBC    Component Value Date/Time   WBC 9.9 06/22/2021 1324   RBC 4.87 06/22/2021 1324   HGB 15.8 (H) 06/22/2021 1324   HCT 46.4 (H) 06/22/2021 1324   PLT 297.0 06/22/2021 1324   MCV 95.3 06/22/2021 1324   MCH 32.9 05/27/2020 1135   MCHC 34.1 06/22/2021 1324   RDW 13.3 06/22/2021 1324   LYMPHSABS 3.0 06/22/2021 1324   MONOABS 0.7 06/22/2021 1324   EOSABS 0.1 06/22/2021 1324   BASOSABS 0.0 06/22/2021 1324    CMP     Component Value Date/Time   NA 138 06/22/2021 1324   K 4.0 06/22/2021 1324   CL 101 06/22/2021 1324   CO2 29 06/22/2021 1324   GLUCOSE 83 06/22/2021 1324   BUN 14 06/22/2021 1324   CREATININE 0.70 06/22/2021 1324   CREATININE 0.63 05/27/2020 1135   CALCIUM 9.9 06/22/2021 1324   PROT 7.4 06/22/2021 1324   ALBUMIN 4.7 06/22/2021 1324   AST 14 06/22/2021 1324   ALT 12 06/22/2021 1324   ALKPHOS 98 06/22/2021 1324   BILITOT 0.4 06/22/2021 1324       Latest Ref Rng & Units 06/22/2021    1:24 PM 05/27/2020   11:35 AM  CBC EXTENDED  WBC 4.0 - 10.5 K/uL 9.9  7.8   RBC 3.87 - 5.11 Mil/uL 4.87  4.65   Hemoglobin 12.0 - 15.0 g/dL 73.2  20.2   HCT 54.2 - 46.0 % 46.4  44.8   Platelets 150.0 - 400.0 K/uL 297.0  278   NEUT# 1.4 - 7.7 K/uL 6.0    Lymph# 0.7 - 4.0 K/uL 3.0        ASSESSMENT AND PLAN: 32 year old female with episodes of abdominal pain and constipation, recently associated with severe facial flushing/tenderness and arthralgias.  It has been 3 months since she has had the symptoms.  Her bowel habits have been fairly normal since then.  She does struggle occasionally with bloating, but no other concerning chronic GI symptoms.  Extensive food allergy testing by her allergist was  negative.  She avoids dairy, but otherwise follows a regular diet.  For any organic underlying GI disorder.  Certainly, constipation can cause abdominal pain, and sometimes this can be associated with vasovagal reactions which may explain some of her symptoms.  Very low suspicion for condition such as systemic mastocytosis or carcinoid syndrome. Given her family history of autoimmune disease and various symptoms, will screen for celiac disease, although suspicion is low. I recommended that she try taking senna as needed for when she has further episodes of constipation. No recommendation for  endoscopic evaluation at this time.  Bloating - TTG/IgA  Constipation - Senna, 2 tabs as needed  Niala Stcharles E. Tomasa Rand, MD [facility]  I spent a total of 35 minutes reviewing the patient's medical record, interviewing and examining the patient, discussing her diagnosis and management of her condition going forward, and documenting in the medical record  Philip Aspen, [place]*

## 2023-02-07 LAB — TISSUE TRANSGLUTAMINASE, IGA: (tTG) Ab, IgA: <1 U/mL

## 2023-02-11 NOTE — Progress Notes (Signed)
Samara Deist,  The test for celiac disease was negative (normal).  Hopefully, you will not continue to have any episodes of pain and flushing like you had in July.  Please take the senna as needed for constipation.  Make a follow-up with me if your symptoms are worsening or if you have any other new concerning GI symptoms.

## 2023-04-18 DIAGNOSIS — M9902 Segmental and somatic dysfunction of thoracic region: Secondary | ICD-10-CM | POA: Diagnosis not present

## 2023-04-18 DIAGNOSIS — M26619 Adhesions and ankylosis of temporomandibular joint, unspecified side: Secondary | ICD-10-CM | POA: Diagnosis not present

## 2023-04-18 DIAGNOSIS — M9901 Segmental and somatic dysfunction of cervical region: Secondary | ICD-10-CM | POA: Diagnosis not present

## 2023-04-18 DIAGNOSIS — M9903 Segmental and somatic dysfunction of lumbar region: Secondary | ICD-10-CM | POA: Diagnosis not present

## 2023-04-18 DIAGNOSIS — M9905 Segmental and somatic dysfunction of pelvic region: Secondary | ICD-10-CM | POA: Diagnosis not present

## 2023-05-09 DIAGNOSIS — M9902 Segmental and somatic dysfunction of thoracic region: Secondary | ICD-10-CM | POA: Diagnosis not present

## 2023-05-09 DIAGNOSIS — M9905 Segmental and somatic dysfunction of pelvic region: Secondary | ICD-10-CM | POA: Diagnosis not present

## 2023-05-09 DIAGNOSIS — M26619 Adhesions and ankylosis of temporomandibular joint, unspecified side: Secondary | ICD-10-CM | POA: Diagnosis not present

## 2023-05-09 DIAGNOSIS — M9903 Segmental and somatic dysfunction of lumbar region: Secondary | ICD-10-CM | POA: Diagnosis not present

## 2023-05-09 DIAGNOSIS — M9901 Segmental and somatic dysfunction of cervical region: Secondary | ICD-10-CM | POA: Diagnosis not present

## 2023-05-30 DIAGNOSIS — M9902 Segmental and somatic dysfunction of thoracic region: Secondary | ICD-10-CM | POA: Diagnosis not present

## 2023-05-30 DIAGNOSIS — M9901 Segmental and somatic dysfunction of cervical region: Secondary | ICD-10-CM | POA: Diagnosis not present

## 2023-05-30 DIAGNOSIS — M9903 Segmental and somatic dysfunction of lumbar region: Secondary | ICD-10-CM | POA: Diagnosis not present

## 2023-05-30 DIAGNOSIS — M26619 Adhesions and ankylosis of temporomandibular joint, unspecified side: Secondary | ICD-10-CM | POA: Diagnosis not present

## 2023-05-30 DIAGNOSIS — M9905 Segmental and somatic dysfunction of pelvic region: Secondary | ICD-10-CM | POA: Diagnosis not present

## 2023-06-26 ENCOUNTER — Other Ambulatory Visit: Payer: Self-pay | Admitting: Obstetrics and Gynecology

## 2023-06-26 DIAGNOSIS — N94819 Vulvodynia, unspecified: Secondary | ICD-10-CM | POA: Diagnosis not present

## 2023-06-26 DIAGNOSIS — Z319 Encounter for procreative management, unspecified: Secondary | ICD-10-CM | POA: Diagnosis not present

## 2023-06-26 DIAGNOSIS — N6310 Unspecified lump in the right breast, unspecified quadrant: Secondary | ICD-10-CM | POA: Diagnosis not present

## 2023-06-26 DIAGNOSIS — Q513 Bicornate uterus: Secondary | ICD-10-CM | POA: Diagnosis not present

## 2023-06-26 DIAGNOSIS — Z91410 Personal history of adult physical and sexual abuse: Secondary | ICD-10-CM | POA: Diagnosis not present

## 2023-06-26 DIAGNOSIS — N939 Abnormal uterine and vaginal bleeding, unspecified: Secondary | ICD-10-CM | POA: Diagnosis not present

## 2023-06-27 DIAGNOSIS — M26619 Adhesions and ankylosis of temporomandibular joint, unspecified side: Secondary | ICD-10-CM | POA: Diagnosis not present

## 2023-06-27 DIAGNOSIS — M9901 Segmental and somatic dysfunction of cervical region: Secondary | ICD-10-CM | POA: Diagnosis not present

## 2023-06-27 DIAGNOSIS — M9905 Segmental and somatic dysfunction of pelvic region: Secondary | ICD-10-CM | POA: Diagnosis not present

## 2023-06-27 DIAGNOSIS — M9903 Segmental and somatic dysfunction of lumbar region: Secondary | ICD-10-CM | POA: Diagnosis not present

## 2023-06-27 DIAGNOSIS — M9902 Segmental and somatic dysfunction of thoracic region: Secondary | ICD-10-CM | POA: Diagnosis not present

## 2023-07-08 ENCOUNTER — Ambulatory Visit
Admission: RE | Admit: 2023-07-08 | Discharge: 2023-07-08 | Disposition: A | Discharge status: Home / Self Care | Source: Ambulatory Visit | Attending: Obstetrics and Gynecology

## 2023-07-08 ENCOUNTER — Ambulatory Visit
Admission: RE | Admit: 2023-07-08 | Discharge: 2023-07-08 | Disposition: A | Discharge status: Home / Self Care | Source: Ambulatory Visit | Attending: Obstetrics and Gynecology | Admitting: Obstetrics and Gynecology

## 2023-07-08 DIAGNOSIS — N6310 Unspecified lump in the right breast, unspecified quadrant: Secondary | ICD-10-CM

## 2023-07-08 DIAGNOSIS — N6315 Unspecified lump in the right breast, overlapping quadrants: Secondary | ICD-10-CM | POA: Diagnosis not present

## 2023-07-24 DIAGNOSIS — M26619 Adhesions and ankylosis of temporomandibular joint, unspecified side: Secondary | ICD-10-CM | POA: Diagnosis not present

## 2023-07-24 DIAGNOSIS — M9903 Segmental and somatic dysfunction of lumbar region: Secondary | ICD-10-CM | POA: Diagnosis not present

## 2023-07-24 DIAGNOSIS — M9905 Segmental and somatic dysfunction of pelvic region: Secondary | ICD-10-CM | POA: Diagnosis not present

## 2023-07-24 DIAGNOSIS — M9902 Segmental and somatic dysfunction of thoracic region: Secondary | ICD-10-CM | POA: Diagnosis not present

## 2023-07-24 DIAGNOSIS — M9901 Segmental and somatic dysfunction of cervical region: Secondary | ICD-10-CM | POA: Diagnosis not present

## 2023-07-29 DIAGNOSIS — N94819 Vulvodynia, unspecified: Secondary | ICD-10-CM | POA: Diagnosis not present

## 2023-07-29 DIAGNOSIS — N939 Abnormal uterine and vaginal bleeding, unspecified: Secondary | ICD-10-CM | POA: Diagnosis not present

## 2023-07-29 DIAGNOSIS — Z319 Encounter for procreative management, unspecified: Secondary | ICD-10-CM | POA: Diagnosis not present

## 2023-07-29 DIAGNOSIS — Z31430 Encounter of female for testing for genetic disease carrier status for procreative management: Secondary | ICD-10-CM | POA: Diagnosis not present

## 2023-08-19 DIAGNOSIS — M9902 Segmental and somatic dysfunction of thoracic region: Secondary | ICD-10-CM | POA: Diagnosis not present

## 2023-08-19 DIAGNOSIS — M9901 Segmental and somatic dysfunction of cervical region: Secondary | ICD-10-CM | POA: Diagnosis not present

## 2023-08-19 DIAGNOSIS — M9905 Segmental and somatic dysfunction of pelvic region: Secondary | ICD-10-CM | POA: Diagnosis not present

## 2023-08-19 DIAGNOSIS — M9903 Segmental and somatic dysfunction of lumbar region: Secondary | ICD-10-CM | POA: Diagnosis not present

## 2023-08-19 DIAGNOSIS — M26619 Adhesions and ankylosis of temporomandibular joint, unspecified side: Secondary | ICD-10-CM | POA: Diagnosis not present

## 2023-09-16 DIAGNOSIS — M9903 Segmental and somatic dysfunction of lumbar region: Secondary | ICD-10-CM | POA: Diagnosis not present

## 2023-09-16 DIAGNOSIS — M9902 Segmental and somatic dysfunction of thoracic region: Secondary | ICD-10-CM | POA: Diagnosis not present

## 2023-09-16 DIAGNOSIS — M9901 Segmental and somatic dysfunction of cervical region: Secondary | ICD-10-CM | POA: Diagnosis not present

## 2023-09-16 DIAGNOSIS — M26619 Adhesions and ankylosis of temporomandibular joint, unspecified side: Secondary | ICD-10-CM | POA: Diagnosis not present

## 2023-09-16 DIAGNOSIS — M9905 Segmental and somatic dysfunction of pelvic region: Secondary | ICD-10-CM | POA: Diagnosis not present

## 2023-10-14 DIAGNOSIS — M9903 Segmental and somatic dysfunction of lumbar region: Secondary | ICD-10-CM | POA: Diagnosis not present

## 2023-10-14 DIAGNOSIS — M9902 Segmental and somatic dysfunction of thoracic region: Secondary | ICD-10-CM | POA: Diagnosis not present

## 2023-10-14 DIAGNOSIS — M9905 Segmental and somatic dysfunction of pelvic region: Secondary | ICD-10-CM | POA: Diagnosis not present

## 2023-10-14 DIAGNOSIS — M9901 Segmental and somatic dysfunction of cervical region: Secondary | ICD-10-CM | POA: Diagnosis not present

## 2023-10-14 DIAGNOSIS — M26619 Adhesions and ankylosis of temporomandibular joint, unspecified side: Secondary | ICD-10-CM | POA: Diagnosis not present

## 2023-11-11 DIAGNOSIS — M26619 Adhesions and ankylosis of temporomandibular joint, unspecified side: Secondary | ICD-10-CM | POA: Diagnosis not present

## 2023-11-11 DIAGNOSIS — M9901 Segmental and somatic dysfunction of cervical region: Secondary | ICD-10-CM | POA: Diagnosis not present

## 2023-11-11 DIAGNOSIS — M9905 Segmental and somatic dysfunction of pelvic region: Secondary | ICD-10-CM | POA: Diagnosis not present

## 2023-11-11 DIAGNOSIS — M9902 Segmental and somatic dysfunction of thoracic region: Secondary | ICD-10-CM | POA: Diagnosis not present

## 2023-11-11 DIAGNOSIS — M9903 Segmental and somatic dysfunction of lumbar region: Secondary | ICD-10-CM | POA: Diagnosis not present

## 2023-12-03 DIAGNOSIS — M9905 Segmental and somatic dysfunction of pelvic region: Secondary | ICD-10-CM | POA: Diagnosis not present

## 2023-12-03 DIAGNOSIS — M9901 Segmental and somatic dysfunction of cervical region: Secondary | ICD-10-CM | POA: Diagnosis not present

## 2023-12-03 DIAGNOSIS — M26619 Adhesions and ankylosis of temporomandibular joint, unspecified side: Secondary | ICD-10-CM | POA: Diagnosis not present

## 2023-12-03 DIAGNOSIS — M9903 Segmental and somatic dysfunction of lumbar region: Secondary | ICD-10-CM | POA: Diagnosis not present

## 2023-12-03 DIAGNOSIS — M9902 Segmental and somatic dysfunction of thoracic region: Secondary | ICD-10-CM | POA: Diagnosis not present

## 2024-01-07 DIAGNOSIS — M9905 Segmental and somatic dysfunction of pelvic region: Secondary | ICD-10-CM | POA: Diagnosis not present

## 2024-01-07 DIAGNOSIS — M26619 Adhesions and ankylosis of temporomandibular joint, unspecified side: Secondary | ICD-10-CM | POA: Diagnosis not present

## 2024-01-07 DIAGNOSIS — M9902 Segmental and somatic dysfunction of thoracic region: Secondary | ICD-10-CM | POA: Diagnosis not present

## 2024-01-07 DIAGNOSIS — M9903 Segmental and somatic dysfunction of lumbar region: Secondary | ICD-10-CM | POA: Diagnosis not present

## 2024-01-07 DIAGNOSIS — M9901 Segmental and somatic dysfunction of cervical region: Secondary | ICD-10-CM | POA: Diagnosis not present

## 2024-02-04 ENCOUNTER — Encounter: Payer: Self-pay | Admitting: Internal Medicine
